# Patient Record
Sex: Female | Born: 1965 | Race: White | Hispanic: No | Marital: Single | Smoking: Former smoker
Health system: Southern US, Community
[De-identification: ages and names within clinical notes are randomized; demographics above are authoritative.]

## PROBLEM LIST (undated history)

## (undated) DIAGNOSIS — M79661 Pain in right lower leg: Secondary | ICD-10-CM

## (undated) DIAGNOSIS — I639 Cerebral infarction, unspecified: Secondary | ICD-10-CM

## (undated) DIAGNOSIS — M79671 Pain in right foot: Secondary | ICD-10-CM

## (undated) DIAGNOSIS — I82409 Acute embolism and thrombosis of unspecified deep veins of unspecified lower extremity: Secondary | ICD-10-CM

## (undated) DIAGNOSIS — M25562 Pain in left knee: Secondary | ICD-10-CM

## (undated) HISTORY — PX: ABDOMINAL SURGERY: SHX537

## (undated) HISTORY — PX: TONSILLECTOMY: SUR1361

## (undated) HISTORY — DX: Pain in right lower leg: M79.661

## (undated) HISTORY — DX: Pain in left knee: M25.562

## (undated) HISTORY — DX: Pain in right foot: M79.671

---

## 2009-06-07 ENCOUNTER — Ambulatory Visit: Payer: Self-pay | Admitting: Sports Medicine

## 2009-06-07 DIAGNOSIS — M216X9 Other acquired deformities of unspecified foot: Secondary | ICD-10-CM

## 2009-06-22 ENCOUNTER — Ambulatory Visit: Payer: Self-pay | Admitting: Sports Medicine

## 2009-07-06 ENCOUNTER — Ambulatory Visit: Payer: Self-pay | Admitting: Sports Medicine

## 2009-07-20 ENCOUNTER — Ambulatory Visit: Payer: Self-pay | Admitting: Sports Medicine

## 2010-06-04 ENCOUNTER — Ambulatory Visit: Payer: Self-pay | Admitting: Sports Medicine

## 2010-06-04 DIAGNOSIS — S93401A Sprain of unspecified ligament of right ankle, initial encounter: Secondary | ICD-10-CM

## 2010-06-05 ENCOUNTER — Ambulatory Visit: Payer: Self-pay | Admitting: Sports Medicine

## 2010-06-05 DIAGNOSIS — R269 Unspecified abnormalities of gait and mobility: Secondary | ICD-10-CM

## 2010-09-18 NOTE — Assessment & Plan Note (Signed)
Summary: 2:00APPT,ORTHOTICS PER [facility]   History of Present Illness: Patient brought back for custom orthotics tried 8 mile run today some lat foot swelling on RT no Bony tenderness noted by patient  with HX of stress fxs we want to try to get her into orthotics today before she does 22 mile training run  Allergies: No Known Drug Allergies  Physical Exam  General:  Well-developed,well-nourished,in no acute distress; alert,appropriate and cooperative throughout examination Msk:  marked cavus foot change soft tissue swelling is minimal and is more located over sinus tarsi on RT only no TT percussion over tib, fib or tarsal bones  gait is forefoot strike withrapid forefoot pronation to first MTP   Impression & Recommendations:  Problem # 1:  ANKLE PAIN, RIGHT (ICD-719.47) this seems soft tissue now no real evidence for stress fx but she is still sensitive over med malleolus also sensitive over sessamoids  Problem # 2:  ABNORMALITY OF GAIT (ICD-781.2)  Patient was fitted for a standard, cushioned, semi-rigid orthotic.  The orthotic was heated and the patient stood on the orthotic blank positioned on the orthotic stand. The patient was positioned in subtalar neutral position and 10 degrees of ankle dorsiflexion in a weight bearing stance. After completion of molding a stable based was applied to the orthotic blank.   The blank was ground to a stable position for weight bearing. size 10 blue swirl base  blue EVA med posting  sessamoid cutout padding on RT additional orthotic padding  first ray post on left  time 40 min  Orders: Orthotic Materials, each unit (L3002)  Problem # 3:  OTHER ACQUIRED DEFORMITY OF ANKLE AND FOOT OTHER (ICD-736.79)  with cavus foot she needs to remain in orthotics for running I don't think sports insoles will be adequate for marathon distances  break in this month and try to use for marathon  Orders: Orthotic Materials, each unit  (L3002)   reck in several mos p adjusting to these  gait looks good once orthotics in place   Orders Added: 1)  Est. Patient Level III [84696] 2)  Orthotic Materials, each unit [L3002]

## 2010-09-18 NOTE — Assessment & Plan Note (Signed)
Summary: pain and swelling to rt foot when running per nm/bmc   Vital Signs:  Patient profile:   45 year old female Pulse rate:   65 / minute BP sitting:   126 / 81  (left arm)  Vitals Entered By: Rochele Pages RN (June 04, 2010 10:03 AM) CC: rt foot pain and swelling with activity x 2 weeks   CC:  rt foot pain and swelling with activity x 2 weeks.  History of Present Illness: Patient presents for evaluation of rt foot pain and swelling with activity x2 weeks.   Patient runs 40 miles per week, cycles 50 miles per week, and swims 1 mile per week. She is training for a marathon on Nov 12.   She first started noticing swelling in her right calf about 2 weeks ago.  Now she reports pain and swelling above and over her her medial maleolous, great toe and ball of foot, and top of foot.  Has 22 mile training run this weekend.  Current Problems (verified): 1)  Ankle Pain, Right  (ICD-719.47) 2)  Other Acquired Deformity of Ankle and Foot Other  (ICD-736.79)  Current Medications (verified): 1)  None  Allergies (verified): No Known Drug Allergies  Past History:  Past Medical History: 3 prior stress fx in last 5 years.   Family History: Mother with DVT  Social History: Runs 40 miles a week.  Has marathon NOv 12th.   Review of Systems  The patient denies anorexia, fever, weight loss, chest pain, syncope, and abdominal pain.    Physical Exam  General:  VS noted.  Well NAD Msk:  Left foot and lower leg. No swelling  or color changes noted.  High arches at navicular prominances 4.5 cm above heel. Flattens to 4cm standing.  Non tender to palpation or percussion.  Mild tenderness over the medial maleoleus with leg squeeze.   Otherwise foot and leg WNL.  Extremities:  Right calf 36 cm 10cm distal to tibial palt Left calf 35 cm 10cm distal to tibial plat   Impression & Recommendations:  Problem # 1:  ANKLE PAIN, RIGHT (ICD-719.47) Stress reaction vs bone pain. Pt at  risk for reccurent tibail stress fx based on prior history and high arch.  Plan to have patient come back tomorrow for custom cushioned orthotic. Need to reduce shock to leg with running.  Will follow up tomorrow.   Problem # 2:  OTHER ACQUIRED DEFORMITY OF ANKLE AND FOOT OTHER (ICD-736.79) With degree oif cavus change in foot I think she will cont to get recurrent stress fxs unless we make an intervention during running   Orders Added: 1)  Est. Patient Level III [40347]

## 2010-10-20 ENCOUNTER — Encounter: Payer: Self-pay | Admitting: *Deleted

## 2012-01-15 DIAGNOSIS — J309 Allergic rhinitis, unspecified: Secondary | ICD-10-CM | POA: Insufficient documentation

## 2012-03-18 DIAGNOSIS — L578 Other skin changes due to chronic exposure to nonionizing radiation: Secondary | ICD-10-CM | POA: Insufficient documentation

## 2012-04-06 DIAGNOSIS — I610 Nontraumatic intracerebral hemorrhage in hemisphere, subcortical: Secondary | ICD-10-CM | POA: Insufficient documentation

## 2012-04-08 DIAGNOSIS — I82409 Acute embolism and thrombosis of unspecified deep veins of unspecified lower extremity: Secondary | ICD-10-CM | POA: Insufficient documentation

## 2012-04-15 DIAGNOSIS — R002 Palpitations: Secondary | ICD-10-CM | POA: Insufficient documentation

## 2012-04-17 DIAGNOSIS — Z7901 Long term (current) use of anticoagulants: Secondary | ICD-10-CM | POA: Insufficient documentation

## 2012-07-23 ENCOUNTER — Encounter: Payer: Self-pay | Admitting: Sports Medicine

## 2012-07-23 ENCOUNTER — Ambulatory Visit (INDEPENDENT_AMBULATORY_CARE_PROVIDER_SITE_OTHER): Payer: 59 | Admitting: Sports Medicine

## 2012-07-23 VITALS — BP 138/80 | HR 62 | Ht 66.0 in | Wt 132.0 lb

## 2012-07-23 DIAGNOSIS — M25562 Pain in left knee: Secondary | ICD-10-CM

## 2012-07-23 DIAGNOSIS — M25569 Pain in unspecified knee: Secondary | ICD-10-CM

## 2012-07-23 HISTORY — DX: Pain in left knee: M25.562

## 2012-07-23 NOTE — Assessment & Plan Note (Signed)
Advised patient to restart her activities  I did not see any evidence of serious knee injury  If she does not get swelling or excessive pain with running I think she can continue this  She will recheck with me if pain returns

## 2012-07-23 NOTE — Progress Notes (Signed)
Patient ID: Agatha Duplechain, female   DOB: [birth year], 46 y.o.   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 Clotting studies normal MRI and CTA did not show AVM  This started with first mile of run Ran 14 more miles but by mile 3 had blurred vision  She also had DVT from angiogram in RT leg Put on Coumadin Has to complete 6 mos  Neurologist - Dr Thad Ranger at [facility] - cleared to run  Cardiac workup - neg and no BP increase abnormal on ETT  Had medial malleolar stress fx about 5 yrs ago RT  Left knee is swelling since this past Saturday Thursday she had some loss of balance with wind gust running in [place] By next day knee painful  Examination NAD  Left Knee: Normal to inspection with no erythema or effusion or obvious bony abnormalities. Palpation normal with no warmth or joint line tenderness or patellar tenderness or condyle tenderness. ROM normal in flexion and extension and lower leg rotation. Ligaments with solid consistent endpoints including ACL, PCL, LCL, MCL. Negative Mcmurray's and provocative meniscal tests. Non painful patellar compression. Patellar and quadriceps tendons unremarkable. Hamstring and quadriceps strength is normal.  Running gait does not show any evidence of limp  MSK ultrasound There is no effusion in the suprapatellar pouch The quadriceps and patellar tendons appear normal The medial and lateral meniscus appear normal

## 2012-10-12 DIAGNOSIS — N950 Postmenopausal bleeding: Secondary | ICD-10-CM | POA: Insufficient documentation

## 2014-10-19 ENCOUNTER — Encounter (INDEPENDENT_AMBULATORY_CARE_PROVIDER_SITE_OTHER): Payer: Self-pay

## 2014-10-19 ENCOUNTER — Ambulatory Visit (INDEPENDENT_AMBULATORY_CARE_PROVIDER_SITE_OTHER): Payer: 59 | Admitting: Family Medicine

## 2014-10-19 ENCOUNTER — Encounter: Payer: Self-pay | Admitting: Family Medicine

## 2014-10-19 VITALS — BP 127/82 | HR 55 | Ht 66.0 in | Wt 130.0 lb

## 2014-10-19 DIAGNOSIS — M79671 Pain in right foot: Secondary | ICD-10-CM

## 2014-10-19 NOTE — Patient Instructions (Signed)
Your exam and ultrasound are reassuring. This pain is due to early plantar fasciitis and posterior tibialis tendinopathy. Cross train the next week (cycling, swimming). Icing 15 minutes at a time 3-4 times a day. Do simple motion exercises (up/downs, side to sides, alphabet) with ankle 3 sets of 10 (do alphabet twice). After 2-3 days start the theraband strengthening exercises and calf raise/drop exercise 3 sets of 10 once a day. When returning to running start at about 50% your regular training program for each day and increase by 10% each week roughly. Follow up with me in 4 weeks. I would run your long one 2-3 weeks before the marathon.

## 2014-10-24 DIAGNOSIS — R7989 Other specified abnormal findings of blood chemistry: Secondary | ICD-10-CM | POA: Insufficient documentation

## 2014-10-24 DIAGNOSIS — M79671 Pain in right foot: Secondary | ICD-10-CM | POA: Insufficient documentation

## 2014-10-24 DIAGNOSIS — H6980 Other specified disorders of Eustachian tube, unspecified ear: Secondary | ICD-10-CM | POA: Insufficient documentation

## 2014-10-24 DIAGNOSIS — N289 Disorder of kidney and ureter, unspecified: Secondary | ICD-10-CM | POA: Insufficient documentation

## 2014-10-24 HISTORY — DX: Pain in right foot: M79.671

## 2014-10-24 NOTE — Assessment & Plan Note (Signed)
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  Exam and ultrasound reassuring and no bony tenderness.  Advised to cross train the next week.  Icing, strengthening exercises reviewed.  Reviewed return to running protocol as well.  Call with any questions or concerns, f/u in 4 weeks.

## 2014-10-24 NOTE — Progress Notes (Signed)
PCP: No primary care provider on file.  Subjective:   HPI: Patient is a 49 y.o. female here for right foot pain.  Patient is currently training for the [place] marathon on 4/18. She started to get pain in the arch of her foot on Saturday at 16 of 19 miles. Monday hurt as well when only 4 1/2 miles into an 8 mile run. Bothered her yesterday when doing hill sprints. Felt swollen last night so decided to make an appointment. Has history of stress fractures but not in this foot. Was ill last week and had a fever - took a break from her training.  No past medical history on file.  No current outpatient prescriptions on file prior to visit.   No current facility-administered medications on file prior to visit.    No past surgical history on file.  Allergies  Allergen Reactions  . Tetracycline Nausea And Vomiting  . Xarelto [Rivaroxaban]     History   Social History  . Marital Status: Single    Spouse Name: N/A  . Number of Children: N/A  . Years of Education: N/A   Occupational History  . Not on file.   Social History Main Topics  . Smoking status: Former Smoker    Quit date: 08/19/2004  . Smokeless tobacco: Never Used  . Alcohol Use: Not on file  . Drug Use: Not on file  . Sexual Activity: Not on file   Other Topics Concern  . Not on file   Social History Narrative    No family history on file.  BP 127/82 mmHg  Pulse 55  Ht 5\' 6"  (1.676 m)  Wt 130 lb (58.968 kg)  BMI 20.99 kg/m2  Review of Systems: See HPI above.    Objective:  Physical Exam:  Gen: NAD  Right foot/ankle: No gross deformity, swelling, ecchymoses FROM with mild pain on internal rotation. TTP proximal to navicular.  No focal bony tenderness. Negative ant drawer and talar tilt.   Negative syndesmotic compression. Negative hop test. Thompsons test negative. NV intact distally.    msk u/s: no evidence bony irregularity of 1st metatarsal, medial malleolus, navicular.  No increased  fluid or tear of post tibialis tendon or sheath.  Assessment & Plan:  1. 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  Exam and ultrasound reassuring and no bony tenderness.  Advised to cross train the next week.  Icing, strengthening exercises reviewed.  Reviewed return to running protocol as well.  Call with any questions or concerns, f/u in 4 weeks.

## 2014-10-25 ENCOUNTER — Ambulatory Visit: Payer: Self-pay | Admitting: Sports Medicine

## 2014-11-16 ENCOUNTER — Ambulatory Visit: Payer: 59 | Admitting: Family Medicine

## 2015-10-03 ENCOUNTER — Ambulatory Visit (INDEPENDENT_AMBULATORY_CARE_PROVIDER_SITE_OTHER): Payer: 59 | Admitting: Sports Medicine

## 2015-10-03 ENCOUNTER — Encounter: Payer: Self-pay | Admitting: Sports Medicine

## 2015-10-03 VITALS — BP 133/80 | HR 65 | Ht 66.0 in | Wt 132.0 lb

## 2015-10-03 DIAGNOSIS — M65852 Other synovitis and tenosynovitis, left thigh: Secondary | ICD-10-CM

## 2015-10-03 DIAGNOSIS — S76309D Unspecified injury of muscle, fascia and tendon of the posterior muscle group at thigh level, unspecified thigh, subsequent encounter: Secondary | ICD-10-CM

## 2015-10-03 DIAGNOSIS — M76892 Other specified enthesopathies of left lower limb, excluding foot: Secondary | ICD-10-CM

## 2015-10-03 NOTE — Progress Notes (Signed)
Kim Gentry - 50 y.o. female MRN [id number]  Date of birth: 1966/06/09  SUBJECTIVE:  Including CC & ROS.  No chief complaint on file. CC: L hamstring pain  HPI: Patient presents with L hamstring pain which began on 08/12/2015. Patient is an active runner and regularly competes in half and full marathons. While running on Dec 24th, she noticed a sharp pain in her left buttock. Patient continued to run through the pain, but it significantly limited both her speed and her ability to run up or down hills. Pain radiates to knee and sometimes to the front of the hip, but never into the calf. Rest improves the pain. Patient was seen by [facility] orthopedics and had MRI, which reported ischio-femoral impingement, a small hamstring tear, fatty infiltration of the quadratus femoris, and hamstring tendonitis. Patient was then referred to a second orthopedics provider, who suggested a pudendal nerve block. Patient was very hesitant to pursue this treatment plan, so she scheduled appointment with [facility] for second opinion.  Significant interval history includes: Stress fracture of the femur, near the lesser trochanter in March 2016. Patient evaluated by [facility] sports medicine and remained non-weight bearing for 3 months. Began PT in April 2016 and continued exercises until Dec 2016. Developed rotator cuff injury from using crutches, which also required PT. Was able to resume running in August. Gradually increased distance and was able to complete 2 half-marathons during the fall.    HISTORY: Past Medical, Surgical, Social, and Family History Reviewed & Updated per EMR.   Pertinent Historical Findings include: PMH significant for history of stroke in Aug 2012 - no residual deficits Former smoker (quit in 2006)  DATA REVIEWED: MRI from 07/2015 - while patient has both tendon changes and Ischio-femoral impingment as noted, none of changes seem impressive to me.  Tendinosis of HS and glut med may  be issues.  I reviewed disk from [facility].  ROS no sciatic radiation of pain;  No joint swelling; no night pain.  PHYSICAL EXAM:  VS: BP:133/80 mmHg  HR:65bpm  TEMP: ( )  RESP:   HT:5\' 6"  (167.6 cm)   WT:132 lb (59.875 kg)  BMI:21.3 PHYSICAL EXAM: Gen: Well-appearing Caucasian female in NAD. HEENT: normocephalic, atraumatic, EOMI  L leg:  No erythema, or soft tissue swelling of the buttock, posterior thigh, or joint effusion of the knee. TTP over the quadratus femoris. No TTP over ischial tuberosity Straight leg raise to 70 degrees without tightness. Full flexion.  3/5 strength in biceps femoris, semitendinosis, and semimembranosis Strong at 90 deg of knee flex but HS testing at extension or full flexion is very weak 3/5 gluteus minimus and gluteus medius strength. 4/5 gluteus maximus strength.  Unable to perform 1 leg stance 2/2 weakness. Neurovascularly intact.  R leg: No erythema or soft tissue swelling of the buttock, posterior thigh, or effusion of the knee. No TTP over ischial tuberosity or quadratus femoris. Straight leg raise to 90 degrees without tightness. Full flexion. 5/5 strength in biceps femoris, semitendinosis, and semimembranosis. 5/5 gluteus minimus, gluteus medius, and gluteus maximus strength.  Able to perform 1 leg stance. Neurovascularly intact.  Running gait She has a shortened stride. There is now limping on the left with poor push off of the left leg and some hesitation.  ASSESSMENT & PLAN: See problem based charting & AVS for pt instructions. 1.) L Hamstring injury / weakness - Patient with significant weakness on physical exam in L hamstring and hip abductors. Weakness began while  patient was non-weight bearing for 3 months after femoral stress fracture. Hamstrings remained weak despite PT, which led to her injury in December. Extensively discussed MRI results with patient. Current pain and weakness is most consistent with hamstring strain, rather than  ischio-femoral impingement. - Provided exercises to improve hamstring and abductor strength - Information provided for patient to order thigh compression sleeve to wear while running for additional support - May continue to run, but advised against hills and speed work - Will follow up in 6 weeks

## 2015-10-03 NOTE — Assessment & Plan Note (Signed)
compression HEP OK to run easy until recheck Heel lifts  Consider NTG if not responding

## 2015-10-03 NOTE — Patient Instructions (Signed)
Hamstring strengthening exercises ([place]):  1.) Diver - stand on left leg, keep knee straight, and bend down toward the ground with R leg raised  2.) While lying on back, pull L leg toward chest with hands behind your thigh. Gradually extend your knee until you feel tightness. - Repeat 2x daily  3.) Backwards lunge - Keep your right leg forward and lunge backward with your left leg  4.) Forward lunge - Keep your right leg back and lunge forward with your left leg.  *Can gradually increase the difficulty with ankle weights with lunges  5.) Side leg raises - Lay on your side and lift your leg up while keeping the knee straight. Do 1 set with ankle straight, 1 with ankle inverted, and 1 with ankle everted.

## 2015-10-04 ENCOUNTER — Encounter: Payer: Self-pay | Admitting: Sports Medicine

## 2015-11-14 ENCOUNTER — Encounter: Payer: Self-pay | Admitting: Sports Medicine

## 2015-11-14 ENCOUNTER — Ambulatory Visit (INDEPENDENT_AMBULATORY_CARE_PROVIDER_SITE_OTHER): Payer: 59 | Admitting: Sports Medicine

## 2015-11-14 VITALS — BP 135/81 | Ht 66.0 in | Wt 132.0 lb

## 2015-11-14 DIAGNOSIS — M76892 Other specified enthesopathies of left lower limb, excluding foot: Secondary | ICD-10-CM

## 2015-11-14 DIAGNOSIS — M65852 Other synovitis and tenosynovitis, left thigh: Secondary | ICD-10-CM

## 2015-11-14 NOTE — Patient Instructions (Signed)
You have improved a lot  Keep up strength at least 3 x per week  3 days per week do this rehab workout Easy 1 mile warm-up Add hops x 10  long strides x 10 Add easy up and easy down hill repeats x 10 Then 1 mile warm down  See me in 2 to 3 months

## 2015-11-14 NOTE — Progress Notes (Signed)
Patient ID: Kim Gentry, female   DOB: Sep 08, 1965, 50 y.o.   MRN: [id number]  CC: F/U of left hamstring weakness  Since last visit able to run steadily but feels slow 80% of pain is resolved Doing HEP She has gradually inc weight to 8 lbs  Has increased running mileage to ~ 30 MPW  Soc HX: non smoker  ROS No sciatica No groin pain No generalized joint issues  PEXAM NAD BP 135/81 mmHg  Ht 5\' 6"  (1.676 m)  Wt 132 lb (59.875 kg)  BMI 21.32 kg/m2  Left HS - pain on previious exam with resisted testing is resolved Strength prior exam was 3+ to 4/ 5 Hamstring strength is now 5/5 left in mult positions LT HS is about 1 cm smaller circ.  Hip abduction now 5/5 Hip rotatoin now 5/5  Running gait is slow but able to hop without pain/ can do long strides

## 2015-11-14 NOTE — Assessment & Plan Note (Signed)
Exc. Improvement in strength  We will progress her workouts  Add running drills - easy up and down hills  Reck 2 mos

## 2016-01-23 ENCOUNTER — Ambulatory Visit (INDEPENDENT_AMBULATORY_CARE_PROVIDER_SITE_OTHER): Payer: 59 | Admitting: Sports Medicine

## 2016-01-23 ENCOUNTER — Encounter: Payer: Self-pay | Admitting: Sports Medicine

## 2016-01-23 VITALS — BP 127/77 | HR 54 | Ht 66.0 in | Wt 130.0 lb

## 2016-01-23 DIAGNOSIS — M76892 Other specified enthesopathies of left lower limb, excluding foot: Secondary | ICD-10-CM

## 2016-01-23 DIAGNOSIS — M65852 Other synovitis and tenosynovitis, left thigh: Secondary | ICD-10-CM

## 2016-01-23 NOTE — Progress Notes (Signed)
Patient ID: Kim Gentry, female   DOB: [birth year], 50 y.o.   MRN: [id number]  CC: F/u L hamstring  HPI: Kim Gentry is a 50 yo female presenting for f/u L hamstring pain/weakness.  Pt states she feels pretty much back to normal when it comes to her previous hamstring pain and weakness.  She is back to running her baseline of 30 miles per week and has successfully incorporated hills without pain.  Has been intermittently using hamstring sleeve.  Does note however one tender spot on the back of her leg near the upper hamstring/lower buttock.  States she feels a hard nodule there that is tender during/after runs and when she presses/sits on it.  Has been there since January, 4/10 pain at its worst but sometimes doesn't hurt at all.  Not trying anything for it.  Excited she has her strength back and wants to work up to full marathon in November.   ROS:  Gen: - fevers/chills MSK: - joint/bone/muscle pain except as in HPI Neuro: - change in gait  PE:  BP 127/77 mmHg  Pulse 54  Ht 5\' 6"  (1.676 m)  Wt 130 lb (58.968 kg)  BMI 20.99 kg/m2 Gen: middle-age woman sitting on exam table in NAD HEENT: normocephalic, atraumatic Pulm: normal work of breathing MSK:  L leg: no joint, bony, or skin abnormalities on inspection, + TTP over nodular structure near midline of posterior thigh near gluteal cleft, full active ROM of knee and hip, knee extension/flexion and hip abduction/adduction strength 5/5, able to hop comfortably 20 feet on L leg, sensation grossly intact/  HS strong tested in mult. positions R leg: no joint, bony, or skin abnormalities on inspection, non-TTP, full active ROM, 5/5 strength knee extension/flexion, hip abduction/adduction, able to hop comfortably feet on R leg, sensation grossly intact Neuro: gait unhindered,  run stride even stride length No excessivet pronation/supination  A/P:  Kim Gentry is a 50 yo female fully healed of L hamstring tendonitis and wekaness with residual tenderpoint  over superior hamstring  L Hamstring tendonitis Pt has recovered fully from this injury 6 months ago given return of normal strength and no residual hamstring pain while running (aside from below) -- Pt advised to continue with running regimen and train for marathon as she wishes, no restrictions -- Advised to use sleeve for longer runs as may help her pain -- Since felt more unsteady hopping on L foot compared to R, advised one leg squats for balance training -- F/U PRN  L superior hamstring tenderpoint Likely cause of pt's tender knot of tissue underneath L gluteal cleft.  Likely composed of small piece of tendon or fascial that has torn and formed ball-like structure.   -- Advised pt that this should not hinder her running ability and should go away with time -- If becomes worse or starts to hamper her activity, set up f/u visit  Francie Massingominick DeFelice, MS4  Agree with assessment and evaluation.  Sterling BigKB Taliana Mersereau, MD

## 2016-01-23 NOTE — Assessment & Plan Note (Signed)
Strength has returned to normal  See OV note  Cont her exercise regimen for HS at least 3x per week over next sev mos.

## 2016-06-04 ENCOUNTER — Encounter: Payer: Self-pay | Admitting: Sports Medicine

## 2016-06-04 ENCOUNTER — Ambulatory Visit (INDEPENDENT_AMBULATORY_CARE_PROVIDER_SITE_OTHER): Payer: 59 | Admitting: Sports Medicine

## 2016-06-04 DIAGNOSIS — S93401A Sprain of unspecified ligament of right ankle, initial encounter: Secondary | ICD-10-CM

## 2016-06-04 NOTE — Assessment & Plan Note (Signed)
This is starting to resolve and suspect was Grade 2  OK to steadily increase activity  Compression would be helpful for next 6 weeks  Reck if probs.

## 2016-06-04 NOTE — Progress Notes (Signed)
   Subjective:  Patient ID: Kim Gentry, female    DOB: 1966/02/07  Age: 50 y.o. MRN: [id number]  CC: No chief complaint on file.   HPI Kim Gentry is a 50 year old woman with a PMH significant for prior cuboidal stress fracture, presents today for right ankle pain. Patient reports that she was attending the "[facility]" for blood cancer. Patient reported that it was dark and raining outside and because of this, she walked into a pothole and twisted her ankle on October 7. Patient's ankle was very swollen. She consulted her orthopedic friend who said that she most likely only had a sprain. Over the next 10 days, swelling has reduced, but patient reports she still has pain when inverting the foot. Patient says that the pain is in the lateral part of her ankle. Patient just ran 2 miles on a treadmill today without much issue. Patient wants to run a marathon she planned for November 5.   Soc Hx: Non smoker who quit in 2006  ROS Review of Systems  Musculoskeletal:       Right Ankle Pain      Right Ankle Swelling  Refer to HPI for pertinent negatives and positives  Objective:  BP (!) 147/73   Pulse (!) 55   Ht 5\' 6"  (1.676 m)   Wt 130 lb (59 kg)   BMI 20.98 kg/m    Physical Exam  Constitutional: She appears well-developed and well-nourished.  Musculoskeletal:  Left Foot: No lesions, deformities, or swelling. Left ankle ligamentous laxity present on eversion and inversion. No bony tenderness. Normal ROM. Normal stability. Neurovascularly intact.  Right Foot: No lesions or deformities. Minor residual swelling on lateral right ankle over peroneal tendon. No real ligamentous deficit on right compared to left foot.  No bony tenderness. 10-15% less ROM when compared to left foot. Normal stability. Neurovascularly intact.      Assessment & Plan:   1. Right Ankle Pain Secondary to Ankle Sprain Based on physical exam and history, we believe that this is a right ankle sprain. Informed  patient and discussed options for treatment. Recommended the use of a compression sleeve for ankle while running and gave her one foot balance and theraband exercises to strengthen her ankle joint. Patient agreed with this course of action. Patient wondered if she could still run marathon in November and if it would be fully healed by then. Informed patient that she could run it then, but there would be no guarantee that her injury would be completely resolved by then. Patient elected to delay marathon in November, and run marathon in December.    Follow-up: No Follow-up on file.   Kim Gentry, Medical Student  I observed and examined the patient with the student and agree with assessment and plan.  Note reviewed and modified by me. Sterling BigKB Fields, MD

## 2016-07-04 ENCOUNTER — Encounter: Payer: Self-pay | Admitting: Sports Medicine

## 2016-07-04 ENCOUNTER — Ambulatory Visit (INDEPENDENT_AMBULATORY_CARE_PROVIDER_SITE_OTHER): Payer: 59 | Admitting: Sports Medicine

## 2016-07-04 DIAGNOSIS — M79661 Pain in right lower leg: Secondary | ICD-10-CM

## 2016-07-04 HISTORY — DX: Pain in right lower leg: M79.661

## 2016-07-04 NOTE — Progress Notes (Signed)
  Kim HomerMary Gentry - 50 y.o. female MRN [id number]  Date of birth: 1965/11/04  SUBJECTIVE:  Including CC & ROS.   Kim HomerMary Gentry is a 50 y.o. F who presents for evaluation of R shin pain x1 wk.  Pain feels light a tightness and searing pain.  Pain is worse in medial lower leg.  Worse with running.  Is better today than yesterday and ran 5 miles without pain this AM.  Last night there was purple discoloration and swelling of calf and foot that is better today.  She has tried ice and compression which made it worse.  She took 1 dose of an NSAID last night which helped some.  She is working toward running a marathon in 3 wks and planning 22 mile run in 2 days.  HISTORY: Past Medical, Surgical, Social, and Family History Reviewed & Updated per EMR.   Pertinent Historical Findings include: PMSHx -  DVT, CVA, osteopenia, stress fractures of femur, cuboid, sesmoid, medial malleolus  PSHx -  Never smoker FHx -  noncontributory Medications - Forteo, ASA 325 daily  DATA REVIEWED: none  PHYSICAL EXAM:  VS: BP:136/62  HR: bpm  TEMP: ( )  RESP:   HT:5\' 5"  (165.1 cm)   WT:132 lb (59.9 kg)  BMI:22 PHYSICAL EXAM: Gen: NAD, alert, cooperative with exam, well-appearing HEENT: clear conjunctiva, EOMI CV:  no edema, capillary refill brisk,  Resp: non-labored, normal speech Skin: no rashes, normal turgor  Neuro: no gross deficits.  Psych:  alert and oriented  R leg: high arches of b/l feet, TTP with deep palpation along medial mid-tibia, no pain with stress of posterior tibialis, strength intact, ROM intact, homan's negative, no calf tenderness  Gait: wide, shuffling, hard impact, no pain in shin while running or hopping  US R lower leg: intact cortex of tibia, varicose vein in area of maximum pain with localized increase in flow and dilation  ASSESSMENT & PLAN:   Pain in right shin Suspect this is related to ruptured varicose vein as seen on US No evidence of stress fracture on US and no pain with high  impact testing such as running/hopping No evidence of DVT Doubt shin splints in experienced runner Add scaphoid pad to sports insoles in R shoe Rest (no long runs) for 2-3 days F/u prn    Erasmo DownerAngela M Bacigalupo, MD, MPH  I observed and examined the patient with the resident and agree with assessment and plan.  Note reviewed and modified by me.   Enid BaasKarl Fields, MD  07/04/2016 12:25 PM

## 2016-07-04 NOTE — Assessment & Plan Note (Signed)
Suspect this is related to ruptured varicose vein as seen on US No evidence of stress fracture on US and no pain with high impact testing such as running/hopping No evidence of DVT Doubt shin splints in experienced runner Add scaphoid pad to sports insoles in R shoe Rest (no long runs) for 2-3 days F/u prn

## 2018-03-11 ENCOUNTER — Emergency Department (INDEPENDENT_AMBULATORY_CARE_PROVIDER_SITE_OTHER)
Admission: EM | Admit: 2018-03-11 | Discharge: 2018-03-11 | Disposition: A | Discharge status: Home / Self Care | Payer: 59 | Source: Home / Self Care | Attending: Family Medicine | Admitting: Family Medicine

## 2018-03-11 ENCOUNTER — Other Ambulatory Visit: Payer: Self-pay

## 2018-03-11 ENCOUNTER — Encounter: Payer: Self-pay | Admitting: Emergency Medicine

## 2018-03-11 DIAGNOSIS — S0501XA Injury of conjunctiva and corneal abrasion without foreign body, right eye, initial encounter: Secondary | ICD-10-CM | POA: Diagnosis not present

## 2018-03-11 HISTORY — DX: Cerebral infarction, unspecified: I63.9

## 2018-03-11 HISTORY — DX: Acute embolism and thrombosis of unspecified deep veins of unspecified lower extremity: I82.409

## 2018-03-11 MED ORDER — SULFACETAMIDE SODIUM 10 % OP SOLN: 1.0000 [drp] | OPHTHALMIC | 0 refills | Status: DC

## 2018-03-11 NOTE — Discharge Instructions (Addendum)
If symptoms become significantly worse during the night or over the weekend, proceed to the local emergency room.  

## 2018-03-11 NOTE — ED Triage Notes (Signed)
Right eye scratched by her dog this morning

## 2018-03-11 NOTE — ED Provider Notes (Signed)
[facility]    CSN: [id number] Arrival date & time: 03/11/18  1712     History   Chief Complaint Chief Complaint  Patient presents with  . Eye Injury    HPI Kim Gentry is a 52 y.o. female.   Patient reports that about 6 hours ago her dog accidentally scratched her right eye. She continues to have a "gritty" sensation in her right eye.  The history is provided by the patient.  Eye Injury  This is a new problem. Episode onset: 6 hours ago. The problem occurs constantly. The problem has not changed since onset.Exacerbated by: blinking. Nothing relieves the symptoms. She has tried nothing for the symptoms.    Past Medical History:  Diagnosis Date  . DVT (deep venous thrombosis) (HCC)   . Stroke [facility])     Patient Active Problem List   Diagnosis Date Noted  . Pain in right shin 07/04/2016  . Hamstring tendonitis of left thigh 10/03/2015  . Dysfunction of eustachian tube 10/24/2014  . Elevated platelet count 10/24/2014  . Impaired renal function 10/24/2014  . Right foot pain 10/24/2014  . Cyst of ovary 10/12/2012  . Hemorrhage, postmenopausal 10/12/2012  . Knee pain, left 07/23/2012  . Cephalalgia 04/29/2012  . Long term current use of anticoagulant therapy 04/17/2012  . Awareness of heartbeats 04/15/2012  . Deep vein thrombosis (HCC) 04/08/2012  . 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 Allergic rhinitis 01/15/2012  . ABNORMALITY OF GAIT 06/05/2010  . Inversion sprain of right ankle 06/04/2010  . OTHER ACQUIRED DEFORMITY OF ANKLE AND FOOT OTHER 06/07/2009    Past Surgical History:  Procedure Laterality Date  . ABDOMINAL SURGERY    . TONSILLECTOMY      OB History   None      Home Medications    Prior to Admission medications   Medication Sig Start Date End Date Taking? Authorizing Provider  aspirin 325 MG tablet Take 325 mg by mouth.    [provider]  sulfacetamide (BLEPH-10) 10 % ophthalmic  solution Place 1-2 drops into the right eye every 4 (four) hours. Use while awake 03/11/18   Lattie Haw, MD    Family History No family history on file.  Social History Social History   Tobacco Use  . Smoking status: Former Smoker    Last attempt to quit: 08/19/2004    Years since quitting: 13.5  . Smokeless tobacco: Never Used  Substance Use Topics  . Alcohol use: Yes    Alcohol/week: 0.0 oz  . Drug use: Not Currently     Allergies   Tetracycline and Xarelto [rivaroxaban]   Review of Systems Review of Systems  Eyes: Positive for pain. Negative for photophobia, discharge, redness, itching and visual disturbance.  All other systems reviewed and are negative.    Physical Exam Triage Vital Signs ED Triage Vitals  Enc Vitals Group     BP 03/11/18 1740 132/83     Pulse Rate 03/11/18 1740 (!) 57     Resp --      Temp 03/11/18 1740 97.6 F (36.4 C)     Temp Source 03/11/18 1740 Oral     SpO2 03/11/18 1740 97 %     Weight 03/11/18 1741 140 lb (63.5 kg)     Height 03/11/18 1741 5\' 6"  (1.676 m)     Head Circumference --      Peak Flow --      Pain Score 03/11/18 1740 2  Pain Loc --      Pain Edu? --      Excl. in GC? --    No data found.  Updated Vital Signs BP 132/83 (BP Location: Right Arm)   Pulse (!) 57   Temp 97.6 F (36.4 C) (Oral)   Ht 5\' 6"  (1.676 m)   Wt 140 lb (63.5 kg)   LMP 03/12/2003 (Approximate)   SpO2 97%   BMI 22.60 kg/m   Visual Acuity Right Eye Distance: 20/30 Left Eye Distance: 20/200 Bilateral Distance: 20/30(w/o correction)  Right Eye Near:   Left Eye Near:    Bilateral Near:     Physical Exam  Constitutional: She appears well-developed and well-nourished. No distress.  HENT:  Head: Atraumatic.  Right Ear: External ear normal.  Left Ear: External ear normal.  Nose: Nose normal.  Mouth/Throat: Oropharynx is clear and moist.  Eyes: Pupils are equal, round, and reactive to light. Conjunctivae, EOM and lids are normal.  Lids are everted and swept, no foreign bodies found. Right eye exhibits no chemosis, no discharge, no exudate and no hordeolum. No foreign body present in the right eye. Left eye exhibits no discharge.    Instilled tetracaine gtt in right eye.  Fluorescein reveals a 2mm diameter minimal superficial corneal abrasion on the right as noted on diagram.   Cardiovascular: Normal rate.  Pulmonary/Chest: Effort normal.  Neurological: She is alert.  Skin: Skin is warm and dry.  Nursing note and vitals reviewed.    UC Treatments / Results  Labs (all labs ordered are listed, but only abnormal results are displayed) Labs Reviewed - No data to display  EKG None  Radiology No results found.  Procedures Procedures (including critical care time)  Medications Ordered in UC Medications - No data to display  Initial Impression / Assessment and Plan / UC Course  I have reviewed the triage vital signs and the nursing notes.  Pertinent labs & imaging results that were available during my care of the patient were reviewed by me and considered in my medical decision making (see chart for details).    Begin sulfacetamide ophthalmic solution. Followup with ophthalmologist if not improved 2 days.  Final Clinical Impressions(s) / UC Diagnoses   Final diagnoses:  Right corneal abrasion, initial encounter     Discharge Instructions     If symptoms become significantly worse during the night or over the weekend, proceed to the local emergency room.     ED Prescriptions    Medication Sig Dispense Auth. Provider   sulfacetamide (BLEPH-10) 10 % ophthalmic solution Place 1-2 drops into the right eye every 4 (four) hours. Use while awake 5 mL Lattie HawBeese, Julissa Browning A, MD        Lattie HawBeese, Lekeya Rollings A, MD 03/12/18 1038

## 2018-04-23 ENCOUNTER — Ambulatory Visit
Admission: RE | Admit: 2018-04-23 | Discharge: 2018-04-23 | Disposition: A | Discharge status: Home / Self Care | Payer: 59 | Source: Ambulatory Visit | Attending: Sports Medicine | Admitting: Sports Medicine

## 2018-04-23 ENCOUNTER — Encounter: Payer: Self-pay | Admitting: Sports Medicine

## 2018-04-23 ENCOUNTER — Encounter

## 2018-04-23 ENCOUNTER — Ambulatory Visit (INDEPENDENT_AMBULATORY_CARE_PROVIDER_SITE_OTHER): Payer: 59 | Admitting: Sports Medicine

## 2018-04-23 VITALS — BP 131/76 | HR 54 | Ht 66.0 in | Wt 134.0 lb

## 2018-04-23 DIAGNOSIS — M25552 Pain in left hip: Secondary | ICD-10-CM

## 2018-04-23 NOTE — Patient Instructions (Addendum)
Today's diagnosis: Left hip pain. - the plan today is to obtain a further workup to rule out a stress fracture  Imaging ordered:  - Left hip Xray - Left hip MRI  New Medications:  - None  Referrals:  - None  Other treatments recommended: - No running - Use crutches while walking to relieve pressure and pain - you may swim or use a stationary bike for cross training  Follow up: 2 weeks

## 2018-04-23 NOTE — Progress Notes (Signed)
  Kim Gentry - 52 y.o. female MRN [id number]  Date of birth: 04-May-1966    SUBJECTIVE:      Chief Complaint:/ HPI:  Patient is a 52 year old female with about 10 days of left hip pain.  Her pain began slowly with no specific injury.  Initially her pain was localized to the posterior hip but has not now begun to radiate around laterally and into the groin.  Initially her pain was only with running and cause her to shorten her stride.  She now experiences pain with walking and has since discontinued running.  Normally she runs at least 30 miles per week.  She does acknowledge a history significant for a femoral stress fracture about 3 years ago.  She uses Tylenol for pain which is somewhat helpful.  She denies any swelling, bruising, or erythema around the hip.  No skin changes.  She does note subjective feeling of weakness in the left hip compared to the right.   ROS:     See HPI  PERTINENT  PMH / PSH FH / / SH:  Past Medical, Surgical, Social, and Family History Reviewed & Updated in the EMR.  Pertinent findings include:  History of hemorrhagic stroke and DVT.  History of stress fracture  OBJECTIVE: BP 131/76   Pulse (!) 54   Ht 5\' 6"  (1.676 m)   Wt 134 lb (60.8 kg)   LMP 03/12/2003 (Approximate)   BMI 21.63 kg/m   Physical Exam:  Vital signs are reviewed.  GEN: Alert and oriented, NAD Pulm: Breathing unlabored PSY: normal mood, congruent affect  MSK: Left hip:  - Inspection: No gross deformity, no swelling, erythema, or ecchymosis - Palpation: Mild pain over the posterior aspect of the greater trochanter with deep palpation.  No tenderness over the proximal insertion of the hamstring - ROM: Normal range of motion on Flexion, extension, abduction, internal and external rotation without pain - Strength: 4+/5 strength with hip flexion and extension.  Otherwise 5/5 strength.  Some pain noted with resisted hip extension. - Special Tests: Negative FABER and FADIR. Negative Trendelenberg.   No pain with hop test  Right hip: No tenderness to palpation over the greater trochanter or proximal hamstring insertion Full range of motion with 5/5 strength Negative Trendelenburg N/V intact distally  ASSESSMENT & PLAN:  1.  Left hip pain- patient is worse than left deep hip pain with no specific injury.  Given her history of having a stress fracture, this is high on the differential to rule out. - X-ray left hip ordered -MRI left hip ordered -Patient advised to use crutches with walking in order to decrease pain - Tylenol as needed for pain control - Swimming or biking in place of running for crosstraining -Follow-up in 2 weeks

## 2018-04-26 ENCOUNTER — Ambulatory Visit
Admission: RE | Admit: 2018-04-26 | Discharge: 2018-04-26 | Disposition: A | Discharge status: Home / Self Care | Payer: 59 | Source: Ambulatory Visit | Attending: Sports Medicine | Admitting: Sports Medicine

## 2018-04-26 DIAGNOSIS — M25552 Pain in left hip: Secondary | ICD-10-CM

## 2018-04-28 ENCOUNTER — Telehealth (INDEPENDENT_AMBULATORY_CARE_PROVIDER_SITE_OTHER): Payer: Self-pay | Admitting: Sports Medicine

## 2018-04-28 NOTE — Telephone Encounter (Signed)
Attempted to call patient at 3:35 today. No answer and call did not forward to voice mail. Will attempt to make additional calls. Pt has followup appt scheduled. Based on MRI results, plan to discuss treatment with physical therapy and intraarticular steroid injection.

## 2018-04-28 NOTE — Telephone Encounter (Signed)
-----   Message from [facility] sent at 04/27/2018  3:43 PM EDT ----- Regarding: FW: mri results   ----- Message ----- From: Lizbeth Bark Sent: 04/27/2018   3:35 PM EDT To: Rutha Bouchard Subject: mri results                                    Pt is asking for a call back with her MRI results.

## 2018-04-30 ENCOUNTER — Telehealth: Payer: Self-pay | Admitting: Sports Medicine

## 2018-04-30 NOTE — Telephone Encounter (Signed)
Patient called regarding MRI results.  Previously obtained a copy of results.  We reviewed these over the phone today line by line and discussed treatment options.  At this time recommended she continue crosstraining with low impact exercises.  We rescheduled her follow-up appointment for 1 week from today as opposed to 2 weeks.  I briefly discussed with her the option of an intra-articular steroid injection.  We also discussed referral to physical therapy.  All patient's questions were answered.  She will follow-up in 1 week.

## 2018-05-07 ENCOUNTER — Encounter: Payer: Self-pay | Admitting: Sports Medicine

## 2018-05-07 ENCOUNTER — Ambulatory Visit: Payer: 59 | Attending: Sports Medicine | Admitting: Physical Therapy

## 2018-05-07 ENCOUNTER — Ambulatory Visit (INDEPENDENT_AMBULATORY_CARE_PROVIDER_SITE_OTHER): Payer: 59 | Admitting: Sports Medicine

## 2018-05-07 VITALS — BP 116/78 | Ht 66.0 in | Wt 132.0 lb

## 2018-05-07 DIAGNOSIS — S76312D Strain of muscle, fascia and tendon of the posterior muscle group at thigh level, left thigh, subsequent encounter: Secondary | ICD-10-CM

## 2018-05-07 DIAGNOSIS — M25552 Pain in left hip: Secondary | ICD-10-CM | POA: Diagnosis not present

## 2018-05-07 DIAGNOSIS — S76302D Unspecified injury of muscle, fascia and tendon of the posterior muscle group at thigh level, left thigh, subsequent encounter: Secondary | ICD-10-CM | POA: Diagnosis not present

## 2018-05-07 DIAGNOSIS — R262 Difficulty in walking, not elsewhere classified: Secondary | ICD-10-CM | POA: Insufficient documentation

## 2018-05-07 DIAGNOSIS — S73192D Other sprain of left hip, subsequent encounter: Secondary | ICD-10-CM | POA: Diagnosis present

## 2018-05-07 NOTE — Progress Notes (Signed)
  Kim HomerMary Gentry - 52 y.o. female MRN [id number]  Date of birth: Jun 02, 1966    SUBJECTIVE:      Chief Complaint:/ HPI:  Pt is a 52 -year-old female that returns for left hip pain.  Overall her pain is more localized to the posterior hip and is generally a 4/10 and occasionally sharp.  She continues to have occasional radiation of her pain anteriorly.  She has not been running since she was last seen.  She has been swimming and cycling without pain.  MRI was ordered at last visit and she is here today for follow-up.  He denies any new swelling, erythema, bruising.  No new numbness or tingling.  No associated skin changes  MRI shows tendinopathy and partial tearing of the left hamstring at its insertion.  There is evidence of ischiofemoral impingement.  Likely torn superior anterior labrum    ROS:     HPI  PERTINENT  PMH / PSH FH / / SH:  Past Medical, Surgical, Social, and Family History Reviewed & Updated in the EMR.     OBJECTIVE: BP 116/78   Ht 5\' 6"  (1.676 m)   Wt 132 lb (59.9 kg)   LMP 03/12/2003 (Approximate)   BMI 21.31 kg/m   Physical Exam:  Vital signs are reviewed.  GEN: Alert and oriented, NAD Pulm: Breathing unlabored PSY: normal mood, congruent affect  MSK: Left hip:  - Inspection: No gross deformity, no swelling, erythema, or ecchymosis - Palpation: No pain over greater trochanter.  Tenderness over the left initial tuberosity. - ROM: Normal range of motion on Flexion, extension, abduction, internal and external rotation without pain - Strength: 5/5 strength with hip flexion and extension.  Otherwise 5/5 strength.  - Neuro/vasc: NV intact - Special Tests: Negative FABER.  Positive FADIR.  Right hip: No tenderness to palpation over the greater trochanter or proximal hamstring insertion Full range of motion with 5/5 strength Negative Trendelenburg N/V intact distally  ASSESSMENT & PLAN:  1. Left acetabular labral tear -overall this is a less painful of the 2  issues.  Discussed potential steroid injection today.  Patient declined at this time.  She will be referred to physical therapy and she will follow-up in 6 weeks  2.  Proximal hamstring tendinopathy and partial tear-overall this is the more symptomatic of the 2 primary issues.  At this time will continue to restrict from running.  She may continue to cross train with cycling or swimming.  Will refer to physical therapy.  Follow-up 6 weeks

## 2018-05-07 NOTE — Patient Instructions (Addendum)
TENS UNIT: This is helpful for muscle pain and spasm.   Search and Purchase a TENS 7000 2nd edition at www.tenspros.com. It should be less than $30.     TENS unit instructions: Do not shower or bathe with the unit on Turn the unit off before removing electrodes or batteries If the electrodes lose stickiness add a drop of water to the electrodes after they are disconnected from the unit and place on plastic sheet. If you continued to have difficulty, call the TENS unit company to purchase more electrodes. Do not apply lotion on the skin area prior to use. Make sure the skin is clean and dry as this will help prolong the life of the electrodes. After use, always check skin for unusual red areas, rash or other skin difficulties. If there are any skin problems, does not apply electrodes to the same area. Never remove the electrodes from the unit by pulling the wires. Do not use the TENS unit or electrodes other than as directed. Do not change electrode placement without consultating your therapist or physician. Keep 2 fingers with between each electrode. Wear time ratio is 2:1, on to off times.    For example on for 30 minutes off for 15 minutes and then on for 30 minutes off for 15 minutes  HEP was printed for pt with SLR all planes, sit to stand, standing H.S curl, and SLS

## 2018-05-07 NOTE — Patient Instructions (Signed)
As we reviewed today you have 2 different injuries to your hip.  You have a labral tear in the front of your hip.  You also have a small tear of your hamstring posteriorly. Continue to focus on crosstraining and avoid running. NSAIDs as needed for pain We will refer to physical therapy to have them work on your hamstrings. If you find the pain in the front of her hip to be worsening, we can do a steroid injection to help with this. We will have you follow-up in 6 weeks

## 2018-05-08 ENCOUNTER — Encounter: Payer: Self-pay | Admitting: Physical Therapy

## 2018-05-08 NOTE — Therapy (Signed)
[facility] [address] [place], [place], [address] Phone: [phone]   Fax:  [phone]  Physical Therapy Evaluation  Patient Details  Name: Kim HomerMary Gentry MRN: [id number] Date of Birth: 1966-07-05 Referring Provider: Eleanora NeighborBarron, J. DO   Encounter Date: 05/07/2018  PT End of Session - 05/08/18 0807    Visit Number  1    Number of Visits  12    Date for PT Re-Evaluation  06/18/18    Authorization Type  UHC    PT Start Time  1630    PT Stop Time  1720    PT Time Calculation (min)  50 min    Activity Tolerance  Patient tolerated treatment well    Behavior During Therapy  [facility] for tasks assessed/performed       Past Medical History:  Diagnosis Date  . DVT (deep venous thrombosis) (HCC)   . Stroke [facility](HCC)     Past Surgical History:  Procedure Laterality Date  . ABDOMINAL SURGERY    . TONSILLECTOMY      There were no vitals filed for this visit.   Subjective Assessment - 05/08/18 0752    Subjective  Pt relays onset insideous onset of Lt hip pain over last 4 weeks that may have started when her dog pulled her quick and hard during running. She quit running but was still having pain so she went to MD. She had MRI showing "tendinopathy and partial tearing of the left hamstring at its insertion.  There is evidence of ischiofemoral impingement.  Likely torn superior anterior labrum"    How long can you walk comfortably?  less than 3-4 steps    Diagnostic tests  MRI    Patient Stated Goals  return to running    Currently in Pain?  Yes    Pain Score  5     Pain Location  Hip    Pain Orientation  Left    Pain Descriptors / Indicators  Aching;Sore;Sharp    Pain Type  Acute pain    Pain Radiating Towards  denies    Pain Onset  1 to 4 weeks ago    Pain Frequency  Intermittent    Aggravating Factors   WB activity    Pain Relieving Factors  sometimes NSAIDS but she is not suppossed to take these     Multiple Pain Sites  No          [facility] PT Assessment - 05/08/18 0001      Assessment   Medical Diagnosis  Lt hip pain, partial labral and H.S tear    Referring Provider  Eleanora NeighborBarron, J. DO    Onset Date/Surgical Date  04/07/18   onset, she hs not had surgery   Next MD Visit  ?    Prior Therapy  none      Precautions   Precautions  None      Restrictions   Weight Bearing Restrictions  No      Balance Screen   Has the patient fallen in the past 6 months  Yes   tripped while running   How many times?  1-2    Has the patient had a decrease in activity level because of a fear of falling?   No      Home Public house managernvironment   Living Environment  Private residence      Prior Function   Level of Independence  Independent    Vocation  Full time employment    Vocation Requirements  office work    Leisure  Artist Status  Within Functional Limits for tasks assessed      Observation/Other Assessments   Focus on Therapeutic Outcomes (FOTO)   52% limit      Sensation   Light Touch  Appears Intact      ROM / Strength   AROM / PROM / Strength  AROM;Strength      AROM   Overall AROM Comments  WFL except hip flxion and knee flexion slightly limited    AROM Assessment Site  --    Right/Left Hip  --    Left Hip Extension  --    Left Hip Flexion  --   slightly limited   Left Hip External Rotation   --    Left Hip Internal Rotation   --    Left Hip ABduction  --    Left Hip ADduction  --    Right/Left Knee  --      Strength   Strength Assessment Site  Hip;Knee    Right/Left Hip  Left    Left Hip Flexion  4/5    Left Hip Extension  4/5    Left Hip External Rotation  5/5    Left Hip Internal Rotation  5/5    Left Hip ABduction  4/5    Left Hip ADduction  4/5    Right/Left Knee  Left    Left Knee Flexion  5/5    Left Knee Extension  5/5      Flexibility   Soft Tissue Assessment /Muscle Length  --   tight H.S, quads, glutes on Lt     Palpation   Palpation comment  TTP  proximal H.S insertion      Ambulation/Gait   Gait Comments  slight antalgic gait on Lt                Objective measurements completed on examination: See above findings.      [facility] Adult PT Treatment/Exercise - 05/08/18 0001      Modalities   Modalities  Cryotherapy;Electrical Stimulation      Cryotherapy   Number Minutes Cryotherapy  15 Minutes    Cryotherapy Location  Hip    Type of Cryotherapy  Ice pack      Electrical Stimulation   Electrical Stimulation Location  Lt hip, H.S    Electrical Stimulation Action  IFC    Electrical Stimulation Parameters  tolerance 15 min    Electrical Stimulation Goals  Pain             PT Education - 05/08/18 [phone]    Education Details  HEP, TENS, POC    Person(s) Educated  Patient    Methods  Explanation;Demonstration;Verbal cues;Handout    Comprehension  Verbalized understanding;Need further instruction          PT Long Term Goals - 05/08/18 0817      PT LONG TERM GOAL #1   Title  Pt will be I and compliant with HEP. 6 weeks 06/18/18    Status  New      PT LONG TERM GOAL #2   Title  Pt will improve Lt hip strength to 5/5 MMT. 6 weeks 06/18/18    Status  New      PT LONG TERM GOAL #3   Title  Pt will be able to ambulate community distances and up/down stairs without pain or difficulty and with normal gait pattern.  6 weeks 06/18/18      PT LONG TERM GOAL #4   Title  Pt will be able to return to running with less than 2/10 overall pain. 6 weeks 06/18/18             Plan - 05/08/18 0808    Clinical Impression Statement  Pt presents to PT with anterior and posterior Lt hip pain. MRI MRI shows tendinopathy and partial tearing of the left hamstring at its insertion.  There is evidence of ischiofemoral impingement.  Likely torn superior anterior labrum. She has pain and difficulty with prolonged WB activity. She will benefit from skilled PT to address her deficits listed below and return her to full  funciton.    Clinical Presentation  Evolving    Clinical Presentation due to:  worsening pain over last 4 weeks    Clinical Decision Making  Moderate    Rehab Potential  Good    PT Frequency  2x / week    PT Duration  6 weeks    PT Treatment/Interventions  Cryotherapy;Electrical Stimulation;Iontophoresis 4mg /ml Dexamethasone;Moist Heat;Ultrasound;Gait training;Stair training;Therapeutic exercise;Neuromuscular re-education;Manual techniques;Passive range of motion;Dry needling;Taping;Joint Manipulations    PT Next Visit Plan  review HEP, hip stretching and stengthening, consider STM and IASTM to H.S, consider modalites    PT Home Exercise Plan  SLR all planes, sit to stand, standing H.S curl, and SLS    Consulted and Agree with Plan of Care  Patient       Patient will benefit from skilled therapeutic intervention in order to improve the following deficits and impairments:  Abnormal gait, Decreased activity tolerance, Decreased endurance, Decreased range of motion, Decreased strength, Difficulty walking, Impaired flexibility, Increased fascial restricitons, Increased muscle spasms, Pain  Visit Diagnosis: Pain in left hip  Partial hamstring tear, left, subsequent encounter  Tear of left acetabular labrum, subsequent encounter  Difficulty in walking, not elsewhere classified     Problem List Patient Active Problem List   Diagnosis Date Noted  . Pain in right shin 07/04/2016  . Hamstring tendonitis of left thigh 10/03/2015  . Dysfunction of eustachian tube 10/24/2014  . Elevated platelet count 10/24/2014  . Impaired renal function 10/24/2014  . Right foot pain 10/24/2014  . Cyst of ovary 10/12/2012  . Hemorrhage, postmenopausal 10/12/2012  . Knee pain, left 07/23/2012  . Cephalalgia 04/29/2012  . Long term current use of anticoagulant therapy 04/17/2012  . Awareness of heartbeats 04/15/2012  . Deep vein thrombosis (HCC) 04/08/2012  . 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 Allergic rhinitis 01/15/2012  . ABNORMALITY OF GAIT 06/05/2010  . Inversion sprain of right ankle 06/04/2010  . OTHER ACQUIRED DEFORMITY OF ANKLE AND FOOT OTHER 06/07/2009    April Manson, PT, DPT 05/08/2018, 8:22 AM  [facility] [address] [place], [place], [address] Phone: [phone]   Fax:  [phone]  Name: Kim Gentry MRN: [id number] Date of Birth: 1965-12-30

## 2018-05-12 ENCOUNTER — Ambulatory Visit: Payer: 59 | Admitting: Physical Therapy

## 2018-05-12 ENCOUNTER — Encounter: Payer: Self-pay | Admitting: Physical Therapy

## 2018-05-12 DIAGNOSIS — R262 Difficulty in walking, not elsewhere classified: Secondary | ICD-10-CM

## 2018-05-12 DIAGNOSIS — M25552 Pain in left hip: Secondary | ICD-10-CM

## 2018-05-12 DIAGNOSIS — S73192D Other sprain of left hip, subsequent encounter: Secondary | ICD-10-CM

## 2018-05-12 DIAGNOSIS — S76312D Strain of muscle, fascia and tendon of the posterior muscle group at thigh level, left thigh, subsequent encounter: Secondary | ICD-10-CM

## 2018-05-12 NOTE — Therapy (Signed)
[place], [place], [address] Phone: [phone]   Fax:  [phone]  Physical Therapy Treatment  Patient Details  Name: Kim Gentry MRN: [id number] Date of Birth: [birth year] Referring Provider: Weldon Inches DO   Encounter Date: 05/12/2018  PT End of Session - 05/12/18 1410    Visit Number  2    Number of Visits  12    Date for PT Re-Evaluation  06/18/18    Authorization Type  UHC    PT Start Time  9371    PT Stop Time  6967    PT Time Calculation (min)  60 min    Activity Tolerance  Patient limited by pain       Past Medical History:  Diagnosis Date  . DVT (deep venous thrombosis) ([place])   . Stroke [facility])     Past Surgical History:  Procedure Laterality Date  . ABDOMINAL SURGERY    . TONSILLECTOMY      There were no vitals filed for this visit.  Subjective Assessment - 05/12/18 1411    Subjective  Elasia reports she is feeling ok, she feels like the pain is less, using the TENs at home and doing some exercise, did 1 hr on the ellipitcal, also swimming freestyle.     Patient Stated Goals  return to running    Currently in Pain?  Yes    Pain Score  5     Pain Location  Hip    Pain Orientation  Left    Pain Descriptors / Indicators  Sharp    Pain Type  Acute pain    Pain Onset  1 to 4 weeks ago    Pain Frequency  Intermittent    Aggravating Factors   running - taking hard force through the leg    Pain Relieving Factors  TENs,          [facility] PT Assessment - 05/12/18 0001      Assessment   Medical Diagnosis  Lt hip pain, partial labral and H.S tear                   [facility] Adult PT Treatment/Exercise - 05/12/18 0001      Exercises   Exercises  Knee/Hip      Knee/Hip Exercises: Stretches   ITB Stretch  Both;3 reps;30 seconds   cross body with strap    Piriformis Stretch  Left;20 seconds    Other Knee/Hip Stretches  chlds pose after quadraped work    Other Knee/Hip  Stretches  BKTC      Knee/Hip Exercises: Aerobic   Nustep  L4x5' LE only      Knee/Hip Exercises: Standing   SLS  Lt dead lift no wt - performed 2 reps had pain with returning to stand, performed 15 reps Rt toe taps FWD/side/back      Knee/Hip Exercises: Supine   Bridges  Strengthening;10 reps;Both    Single Leg Bridge  Both;2 sets;10 reps   no pain with Lt LE     Knee/Hip Exercises: Sidelying   Clams  2x10 regular and reverse Lt LE with 3# wts      Knee/Hip Exercises: Prone   Other Prone Exercises  quadraped kick backs, attempted Lt LE with red band, had increased pain into HS so stopped and did without resisitance, focusing on eccentric , then HS curls with knee lifted  2x10 each one      Modalities   Modalities  Cryotherapy  Cryotherapy   Number Minutes Cryotherapy  12 Minutes    Cryotherapy Location  --   Lt buttocks and hamstring in prone   Type of Cryotherapy  Ice pack      Electrical Stimulation   Electrical Stimulation Location  --   pt will do at home     Manual Therapy   Manual Therapy  Soft tissue mobilization    Soft tissue mobilization  STM to Lt HS, some tightness about 2" from oigin into semi membranous                  PT Long Term Goals - 05/12/18 1451      PT LONG TERM GOAL #1   Title  Pt will be I and compliant with HEP. 6 weeks 06/18/18    Status  On-going      PT LONG TERM GOAL #2   Title  Pt will improve Lt hip strength to 5/5 MMT. 6 weeks 06/18/18    Status  On-going      PT LONG TERM GOAL #3   Title  Pt will be able to ambulate community distances and up/down stairs without pain or difficulty and with normal gait pattern. 6 weeks 06/18/18    Status  On-going      PT LONG TERM GOAL #4   Title  Pt will be able to return to running with less than 2/10 overall pain. 6 weeks 06/18/18    Status  On-going            Plan - 05/12/18 1451    Clinical Impression Statement  This is Meliah's second visit, she is doing well with  her current HEP and has been performing cardio ex on her own.  She has pain with higher level eccentric exercise of the Lt hamstring.  No goals met however she does reports some decrease in pain with the use of her home TENs.     Rehab Potential  Good    PT Frequency  2x / week    PT Duration  6 weeks    PT Treatment/Interventions  Cryotherapy;Electrical Stimulation;Iontophoresis '4mg'$ /ml Dexamethasone;Moist Heat;Ultrasound;Gait training;Stair training;Therapeutic exercise;Neuromuscular re-education;Manual techniques;Passive range of motion;Dry needling;Taping;Joint Manipulations    PT Next Visit Plan  cont with eccentric hip strengthening, slowly add in single leg activities and core     Consulted and Agree with Plan of Care  Patient       Patient will benefit from skilled therapeutic intervention in order to improve the following deficits and impairments:  Abnormal gait, Decreased activity tolerance, Decreased endurance, Decreased range of motion, Decreased strength, Difficulty walking, Impaired flexibility, Increased fascial restricitons, Increased muscle spasms, Pain  Visit Diagnosis: Pain in left hip  Partial hamstring tear, left, subsequent encounter  Tear of left acetabular labrum, subsequent encounter  Difficulty in walking, not elsewhere classified     Problem List Patient Active Problem List   Diagnosis Date Noted  . Pain in right shin 07/04/2016  . Hamstring tendonitis of left thigh 10/03/2015  . Dysfunction of eustachian tube 10/24/2014  . Elevated platelet count 10/24/2014  . Impaired renal function 10/24/2014  . Right foot pain 10/24/2014  . Cyst of ovary 10/12/2012  . Hemorrhage, postmenopausal 10/12/2012  . Knee pain, left 07/23/2012  . Cephalalgia 04/29/2012  . Long term current use of anticoagulant therapy 04/17/2012  . Awareness of heartbeats 04/15/2012  . Deep vein thrombosis ([place]) 04/08/2012  . Thalamic hemorrhage ([place]) 04/06/2012  . Actinic degeneration  03/18/2012  .  Allergic rhinitis 01/15/2012  . ABNORMALITY OF GAIT 06/05/2010  . Inversion sprain of right ankle 06/04/2010  . OTHER ACQUIRED DEFORMITY OF ANKLE AND FOOT OTHER 06/07/2009    Jeral Pinch PT  05/12/2018, 3:00 PM  [facility] [address] [place], [place], [address] Phone: [phone]   Fax:  [phone]  Name: Kynslei Art MRN: [id number] Date of Birth: [birth year]

## 2018-05-14 ENCOUNTER — Ambulatory Visit: Payer: 59 | Admitting: Sports Medicine

## 2018-05-15 ENCOUNTER — Encounter

## 2018-05-18 ENCOUNTER — Encounter: Payer: Self-pay | Admitting: Physical Therapy

## 2018-05-18 ENCOUNTER — Ambulatory Visit: Payer: 59 | Admitting: Physical Therapy

## 2018-05-18 DIAGNOSIS — S73192D Other sprain of left hip, subsequent encounter: Secondary | ICD-10-CM

## 2018-05-18 DIAGNOSIS — R262 Difficulty in walking, not elsewhere classified: Secondary | ICD-10-CM

## 2018-05-18 DIAGNOSIS — M25552 Pain in left hip: Secondary | ICD-10-CM

## 2018-05-18 DIAGNOSIS — S76312D Strain of muscle, fascia and tendon of the posterior muscle group at thigh level, left thigh, subsequent encounter: Secondary | ICD-10-CM

## 2018-05-18 NOTE — Therapy (Signed)
[place], [place], [address] Phone: [phone]   Fax:  [phone]  Physical Therapy Treatment  Patient Details  Name: Roslin Norwood MRN: [id number] Date of Birth: [birth year] Referring Provider (PT): Weldon Inches DO   Encounter Date: 05/18/2018  PT End of Session - 05/18/18 0934    Visit Number  3    Number of Visits  12    Date for PT Re-Evaluation  06/18/18    Authorization Type  UHC    PT Start Time  0934    PT Stop Time  1023    PT Time Calculation (min)  49 min    Activity Tolerance  Patient tolerated treatment well       Past Medical History:  Diagnosis Date  . DVT (deep venous thrombosis) ([place])   . Stroke [facility])     Past Surgical History:  Procedure Laterality Date  . ABDOMINAL SURGERY    . TONSILLECTOMY      There were no vitals filed for this visit.  Subjective Assessment - 05/18/18 0934    Subjective  Yzabelle reports she stays sore.  She is concerned about doing all her HEP if we add more, ( discussed modifiying HEP instead of adding to it)     Patient Stated Goals  return to running    Currently in Pain?  Yes    Pain Score  5     Pain Location  Buttocks    Pain Orientation  Left    Pain Descriptors / Indicators  Sharp    Pain Type  Acute pain    Pain Onset  More than a month ago    Pain Frequency  Constant    Aggravating Factors   constant    Pain Relieving Factors  TENS                       [facility] Adult PT Treatment/Exercise - 05/18/18 0001      Exercises   Exercises  Knee/Hip      Knee/Hip Exercises: Stretches   Active Hamstring Stretch  Left;3 reps;30 seconds   seated FWD bend     Knee/Hip Exercises: Aerobic   Elliptical  L5x5'    Nustep  --      Knee/Hip Exercises: Sidelying   Other Sidelying Knee/Hip Exercises  3x10 sec side planks      Knee/Hip Exercises: Prone   Hip Extension  Strengthening;Both;3 sets;10 reps   quadruped kick backs, green band  knees   Other Prone Exercises  quadruped firehydrants, green band at knees 3x10      Modalities   Modalities  Cryotherapy      Cryotherapy   Number Minutes Cryotherapy  10 Minutes    Cryotherapy Location  --   prone HS origin/buttock area   Type of Cryotherapy  Ice pack      Manual Therapy   Manual Therapy  Joint mobilization;Manual Traction    Joint Mobilization  Lt hip grade II-III, posterior hip mod, then distraction with stretch into SKTC    Manual Traction  Lt hip traction with stretch into abduction             PT Education - 05/18/18 0959    Education Details  HEP progression    Person(s) Educated  Patient    Methods  Explanation;Demonstration;Handout    Comprehension  Returned demonstration;Verbalized understanding          PT Long Term Goals -  05/18/18 0947      PT LONG TERM GOAL #1   Title  Pt will be I and compliant with HEP. 6 weeks 06/18/18    Status  On-going      PT LONG TERM GOAL #2   Title  Pt will improve Lt hip strength to 5/5 MMT. 6 weeks 06/18/18    Status  On-going      PT LONG TERM GOAL #3   Title  Pt will be able to ambulate community distances and up/down stairs without pain or difficulty and with normal gait pattern. 6 weeks 06/18/18    Baseline  Pt reports that stairs don't bother her    Status  Partially Met      PT LONG TERM GOAL #4   Title  Pt will be able to return to running with less than 2/10 overall pain. 6 weeks 06/18/18    Status  On-going            Plan - 05/18/18 1000    Clinical Impression Statement  [place] continues with pain into her hamsting origin area Lt.  She is a little overwhelmed with all her HEP, the exercises were decreased and progressed today.  She tolerated them well.  She may benefit from ionto to the Lt hamstring origin, she is going to check with her primary MD to see if she is allowed to use this meds.     Rehab Potential  Good    PT Frequency  2x / week    PT Duration  6 weeks    PT  Treatment/Interventions  Cryotherapy;Electrical Stimulation;Iontophoresis 95m/ml Dexamethasone;Moist Heat;Ultrasound;Gait training;Stair training;Therapeutic exercise;Neuromuscular re-education;Manual techniques;Passive range of motion;Dry needling;Taping;Joint Manipulations    PT Next Visit Plan  add ionto if MD ok's cont with eccentric hip strengthening, slowly add in single leg activities and core  = assess response to new HEP    Consulted and Agree with Plan of Care  Patient       Patient will benefit from skilled therapeutic intervention in order to improve the following deficits and impairments:  Abnormal gait, Decreased activity tolerance, Decreased endurance, Decreased range of motion, Decreased strength, Difficulty walking, Impaired flexibility, Increased fascial restricitons, Increased muscle spasms, Pain  Visit Diagnosis: Pain in left hip  Partial hamstring tear, left, subsequent encounter  Tear of left acetabular labrum, subsequent encounter  Difficulty in walking, not elsewhere classified     Problem List Patient Active Problem List   Diagnosis Date Noted  . Pain in right shin 07/04/2016  . Hamstring tendonitis of left thigh 10/03/2015  . Dysfunction of eustachian tube 10/24/2014  . Elevated platelet count 10/24/2014  . Impaired renal function 10/24/2014  . Right foot pain 10/24/2014  . Cyst of ovary 10/12/2012  . Hemorrhage, postmenopausal 10/12/2012  . Knee pain, left 07/23/2012  . Cephalalgia 04/29/2012  . Long term current use of anticoagulant therapy 04/17/2012  . Awareness of heartbeats 04/15/2012  . [place] vein [place] ([place] 04/08/2012  . [place] 04/06/2012  . 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 Allergic rhinitis 01/15/2012  . ABNORMALITY OF GAIT 06/05/2010  . Inversion sprain of right ankle 06/04/2010  . OTHER ACQUIRED DEFORMITY OF ANKLE AND FOOT OTHER 06/07/2009    SBoneta LucksrPT  05/18/2018, 10:16 AM  [facility] [address]Phone: [phone]  Fax:  [phone] Name: MJaquana GeigerMRN: [id number] of Birth: 3December 25, 1967

## 2018-05-19 ENCOUNTER — Ambulatory Visit: Payer: 59 | Admitting: Physical Therapy

## 2018-05-19 ENCOUNTER — Ambulatory Visit: Payer: 59 | Admitting: Sports Medicine

## 2018-05-21 ENCOUNTER — Encounter: Payer: Self-pay | Admitting: Physical Therapy

## 2018-05-21 ENCOUNTER — Ambulatory Visit: Payer: 59 | Attending: Sports Medicine | Admitting: Physical Therapy

## 2018-05-21 DIAGNOSIS — S73192D Other sprain of left hip, subsequent encounter: Secondary | ICD-10-CM

## 2018-05-21 DIAGNOSIS — R262 Difficulty in walking, not elsewhere classified: Secondary | ICD-10-CM | POA: Insufficient documentation

## 2018-05-21 DIAGNOSIS — S76312D Strain of muscle, fascia and tendon of the posterior muscle group at thigh level, left thigh, subsequent encounter: Secondary | ICD-10-CM | POA: Diagnosis present

## 2018-05-21 DIAGNOSIS — M25552 Pain in left hip: Secondary | ICD-10-CM | POA: Diagnosis not present

## 2018-05-21 NOTE — Patient Instructions (Signed)
IONTOPHORESIS PATIENT PRECAUTIONS & CONTRAINDICATIONS:  . Redness under one or both electrodes can occur.  This characterized by a uniform redness that usually disappears within 12 hours of treatment. . Small pinhead size blisters may result in response to the drug.  Contact your physician if the problem persists more than 24 hours. . On rare occasions, iontophoresis therapy can result in temporary skin reactions such as rash, inflammation, irritation or burns.  The skin reactions may be the result of individual sensitivity to the ionic solution used, the condition of the skin at the start of treatment, reaction to the materials in the electrodes, allergies or sensitivity to dexamethasone, or a poor connection between the patch and your skin.  Discontinue using iontophoresis if you have any of these reactions and report to your therapist. . Remove the Patch or electrodes if you have any undue sensation of pain or burning during the treatment and report discomfort to your therapist. . Tell your Therapist if you have had known adverse reactions to the application of electrical current. . Wear the patch for 8 hours, perform a skin check when you take it off and throw it away.  . The Patch can be worn during normal activity, however excessive motion where the electrodes have been placed can cause poor contact between the skin and the electrode or uneven electrical current resulting in greater risk of skin irritation. Marland Kitchen Keep out of the reach of children.   . DO NOT use if you have a known sensitivity to dexamethasone. . DO NOT use during Magnetic Resonance Imaging (MRI). . DO NOT use over broken or compromised skin (e.g. sunburn, cuts, or acne) due to the increased risk of skin reaction. . DO NOT SHAVE over the area to be treated:  To establish good contact between the Patch and the skin, excessive hair may be clipped. . DO NOT place the Patch or electrodes on or over your eyes, directly over your  heart, or brain. . DO NOT reuse the Patch or electrodes as this may cause burns to occur.

## 2018-05-21 NOTE — Therapy (Signed)
[place], [place], [address] Phone: [phone]   Fax:  [phone]  Physical Therapy Treatment  Patient Details  Name: Kim Gentry MRN: [id number] Date of Birth: 1966-04-25 Referring Provider (PT): Weldon Inches DO   Encounter Date: 05/21/2018  PT End of Session - 05/21/18 1058    Visit Number  4    Number of Visits  12    Date for PT Re-Evaluation  06/18/18    Authorization Type  UHC    PT Start Time  9163    PT Stop Time  1139    PT Time Calculation (min)  41 min       Past Medical History:  Diagnosis Date  . DVT (deep venous thrombosis) ([place])   . Stroke [facility])     Past Surgical History:  Procedure Laterality Date  . ABDOMINAL SURGERY    . TONSILLECTOMY      There were no vitals filed for this visit.  Subjective Assessment - 05/21/18 1059    Subjective  Kim Gentry is cleared and ready to try the ionto. No change in her symptoms    Patient Stated Goals  return to running    Currently in Pain?  Yes    Pain Score  5     Pain Location  Buttocks    Pain Orientation  Left    Pain Descriptors / Indicators  Sharp                       [facility] Adult PT Treatment/Exercise - 05/21/18 0001      Exercises   Exercises  Knee/Hip      Knee/Hip Exercises: Stretches   Active Hamstring Stretch  --   figure 4 and cross body with strap   ITB Stretch  Left;2 reps;30 seconds      Knee/Hip Exercises: Aerobic   Elliptical  L6x5'      Knee/Hip Exercises: Standing   SLS  3x10 bilat with kick backs, glut squeeze at end range      Knee/Hip Exercises: Sidelying   Other Sidelying Knee/Hip Exercises  Lt hip,2x 10 reps each pilates FWD/BWD kicks, CW/CCW circles, FWD/BWD toe tap      Modalities   Modalities  Ultrasound;Iontophoresis      Ultrasound   Ultrasound Location  Lt ischial tubersosity    Ultrasound Parameters  50% 1.63mz, 1.0w/cm2    Ultrasound Goals  Pain      Iontophoresis   Type of  Iontophoresis  Dexamethasone    Location  Lt ischial tuberosity    Dose  1.0cc    Time  899mp patch             PT Education - 05/21/18 1116    Education Details  Ionto    Methods  Explanation;Handout    Comprehension  Verbalized understanding          PT Long Term Goals - 05/18/18 0947      PT LONG TERM GOAL #1   Title  Pt will be I and compliant with HEP. 6 weeks 06/18/18    Status  On-going      PT LONG TERM GOAL #2   Title  Pt will improve Lt hip strength to 5/5 MMT. 6 weeks 06/18/18    Status  On-going      PT LONG TERM GOAL #3   Title  Pt will be able to ambulate community distances and up/down stairs without pain or difficulty  and with normal gait pattern. 6 weeks 06/18/18    Baseline  Pt reports that stairs don't bother her    Status  Partially Met      PT LONG TERM GOAL #4   Title  Pt will be able to return to running with less than 2/10 overall pain. 6 weeks 06/18/18    Status  On-going            Plan - 05/21/18 1144    Clinical Impression Statement  Kim Gentry has had no change in pain with current treatment.  She has noticed increased strength in her legs and core.  Therapuetic ultrasound and ionto were added today to see if we can reduce her pain.      Rehab Potential  Good    PT Frequency  2x / week    PT Duration  6 weeks    PT Treatment/Interventions  Cryotherapy;Electrical Stimulation;Iontophoresis 52m/ml Dexamethasone;Moist Heat;Ultrasound;Gait training;Stair training;Therapeutic exercise;Neuromuscular re-education;Manual techniques;Passive range of motion;Dry needling;Taping;Joint Manipulations    PT Next Visit Plan  assess response to [place] ionto and cont    Consulted and Agree with Plan of Care  Patient       Patient will benefit from skilled therapeutic intervention in order to improve the following deficits and impairments:  Abnormal gait, Decreased activity tolerance, Decreased endurance, Decreased range of motion, Decreased strength,  Difficulty walking, Impaired flexibility, Increased fascial restricitons, Increased muscle spasms, Pain  Visit Diagnosis: Pain in left hip  Partial hamstring tear, left, subsequent encounter  Tear of left acetabular labrum, subsequent encounter  Difficulty in walking, not elsewhere classified     Problem List Patient Active Problem List   Diagnosis Date Noted  . Pain in right shin 07/04/2016  . Hamstring tendonitis of left thigh 10/03/2015  . Dysfunction of eustachian tube 10/24/2014  . Elevated platelet count 10/24/2014  . Impaired renal function 10/24/2014  . Right foot pain 10/24/2014  . Cyst of ovary 10/12/2012  . Hemorrhage, postmenopausal 10/12/2012  . Knee pain, left 07/23/2012  . Cephalalgia 04/29/2012  . Long term current use of anticoagulant therapy 04/17/2012  . Awareness of heartbeats 04/15/2012  . Deep vein thrombosis ([place] 04/08/2012  . 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 Allergic rhinitis 01/15/2012  . ABNORMALITY OF GAIT 06/05/2010  . Inversion sprain of right ankle 06/04/2010  . OTHER ACQUIRED DEFORMITY OF ANKLE AND FOOT OTHER 06/07/2009    SJeral PinchPT  05/21/2018, 11:45 AM  [facility] RoadGHazard [place] [address]Phone: [phone]  Fax:  [phone] Name: Kim QuigleyMRN: [id number] of Birth: [birth year]

## 2018-05-26 ENCOUNTER — Other Ambulatory Visit: Payer: Self-pay

## 2018-05-26 ENCOUNTER — Encounter: Payer: Self-pay | Admitting: Physical Therapy

## 2018-05-26 ENCOUNTER — Ambulatory Visit: Payer: 59 | Admitting: Physical Therapy

## 2018-05-26 DIAGNOSIS — M25552 Pain in left hip: Secondary | ICD-10-CM

## 2018-05-26 DIAGNOSIS — S76312D Strain of muscle, fascia and tendon of the posterior muscle group at thigh level, left thigh, subsequent encounter: Secondary | ICD-10-CM

## 2018-05-26 DIAGNOSIS — S73192D Other sprain of left hip, subsequent encounter: Secondary | ICD-10-CM

## 2018-05-26 DIAGNOSIS — R262 Difficulty in walking, not elsewhere classified: Secondary | ICD-10-CM

## 2018-05-26 NOTE — Therapy (Signed)
[place], [place], [address] Phone: [phone]   Fax:  [phone]  Physical Therapy Treatment  Patient Details  Name: Kim Gentry MRN: [id number] Date of Birth: 1966/05/31 Referring Provider (PT): Weldon Inches DO   Encounter Date: 05/26/2018  PT End of Session - 05/26/18 1317    Visit Number  5    Number of Visits  12    Date for PT Re-Evaluation  06/18/18    Authorization Type  UHC    PT Start Time  1143    PT Stop Time  1228    PT Time Calculation (min)  45 min    Activity Tolerance  Patient tolerated treatment well    Behavior During Therapy  [facility] for tasks assessed/performed       Past Medical History:  Diagnosis Date  . DVT (deep venous thrombosis) ([place])   . Stroke [facility])     Past Surgical History:  Procedure Laterality Date  . ABDOMINAL SURGERY    . TONSILLECTOMY      There were no vitals filed for this visit.  Subjective Assessment - 05/26/18 1311    Subjective  Pt. reports 1-2 days of relief from trial ionto as well as ultrasound at last session followed by return of pain.     Limitations  Walking   running   Diagnostic tests  MRI    Patient Stated Goals  return to running    Currently in Pain?  Yes    Pain Score  5     Pain Location  Buttocks    Pain Orientation  Left    Pain Descriptors / Indicators  Sharp    Pain Type  Acute pain    Pain Radiating Towards  no radiating symptoms    Pain Onset  More than a month ago    Aggravating Factors   walking, running    Pain Relieving Factors  rest, TENS    Multiple Pain Sites  No                       [facility] Adult PT Treatment/Exercise - 05/26/18 0001      Exercises   Exercises  Knee/Hip      Knee/Hip Exercises: Stretches   Active Hamstring Stretch  3 reps;30 seconds;Left    ITB Stretch  Left;3 reps;30 seconds      Knee/Hip Exercises: Seated   Other Seated Knee/Hip Exercises  gentle seated hamstring isometric  with RLE resist 1x15   5 second holds     Knee/Hip Exercises: Prone   Hip Extension  --   prone eccentric hamstring curl 2.5 lbs. 2x10     Modalities   Modalities  Iontophoresis;Ultrasound      Ultrasound   Ultrasound Location  Left ischial tuberosity    Ultrasound Parameters  100% 1.0 W/cm2 1 MHZ    Ultrasound Goals  Pain      Iontophoresis   Type of Iontophoresis  Dexamethasone    Location  Lt ischial tuberosity    Dose  1.0cc    Time  43mmp patch      Manual Therapy   Manual Therapy  Myofascial release;Joint mobilization    Joint Mobilization  left hip long axis distraction grade I-III    Soft tissue mobilization  left hamstring             PT Education - 05/26/18 1316    Education Details  HEP updates-added light isometrics  and gentle prone eccentrics    Person(s) Educated  Patient    Methods  Explanation;Demonstration;Tactile cues;Verbal cues    Comprehension  Verbal cues required;Verbalized understanding;Returned demonstration;Tactile cues required          PT Long Term Goals - 05/26/18 1320      PT LONG TERM GOAL #1   Title  Pt will be I and compliant with HEP. 6 weeks 06/18/18    Time  6    Period  Weeks    Status  On-going      PT LONG TERM GOAL #2   Title  Pt will improve Lt hip strength to 5/5 MMT. 6 weeks 06/18/18    Time  6    Period  Weeks    Status  On-going      PT LONG TERM GOAL #3   Title  Pt will be able to ambulate community distances and up/down stairs without pain or difficulty and with normal gait pattern. 6 weeks 06/18/18    Time  6    Period  Weeks    Status  Partially Met      PT LONG TERM GOAL #4   Title  Pt will be able to return to running with less than 2/10 overall pain. 6 weeks 06/18/18    Time  6    Period  Weeks    Status  On-going            Plan - 05/26/18 1317    Clinical Impression Statement  Temporary improvement with addition ionto so continued with this today. Added light eccentrics and isometrics  to promote tendon healing. Fair progress with therapy to date but given etiology symptoms and underlying partial hamstring tear with tendinopathy expect progress will be gradual.    Clinical Presentation  Stable    Clinical Decision Making  Low    Rehab Potential  Fair    Clinical Impairments Affecting Rehab Potential  underlying MRI findings    PT Frequency  2x / week    PT Duration  6 weeks    PT Treatment/Interventions  Cryotherapy;Electrical Stimulation;Iontophoresis 61m/ml Dexamethasone;Moist Heat;Ultrasound;Gait training;Stair training;Therapeutic exercise;Neuromuscular re-education;Manual techniques;Passive range of motion;Dry needling;Taping;Joint Manipulations    PT Next Visit Plan  continue [place] ionto, manual therapy, exercise progression as tolerated    PT Home Exercise Plan  SLR all planes, sit to stand, standing H.S curl, and SLS, added gentle seated isometrics and prone eccentrics    Consulted and Agree with Plan of Care  Patient       Patient will benefit from skilled therapeutic intervention in order to improve the following deficits and impairments:  Abnormal gait, Decreased activity tolerance, Decreased endurance, Decreased range of motion, Decreased strength, Difficulty walking, Impaired flexibility, Increased fascial restricitons, Increased muscle spasms, Pain  Visit Diagnosis: Pain in left hip  Partial hamstring tear, left, subsequent encounter  Tear of left acetabular labrum, subsequent encounter  Difficulty in walking, not elsewhere classified     Problem List Patient Active Problem List   Diagnosis Date Noted  . Pain in right shin 07/04/2016  . Hamstring tendonitis of left thigh 10/03/2015  . Dysfunction of eustachian tube 10/24/2014  . Elevated platelet count 10/24/2014  . Impaired renal function 10/24/2014  . Right foot pain 10/24/2014  . Cyst of ovary 10/12/2012  . Hemorrhage, postmenopausal 10/12/2012  . Knee pain, left 07/23/2012  . Cephalalgia  04/29/2012  . Long term current use of anticoagulant therapy 04/17/2012  . Awareness of heartbeats 04/15/2012  .  Deep vein thrombosis ([place]) 04/08/2012  . 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 Allergic rhinitis 01/15/2012  . ABNORMALITY OF GAIT 06/05/2010  . Inversion sprain of right ankle 06/04/2010  . OTHER ACQUIRED DEFORMITY OF ANKLE AND FOOT OTHER 06/07/2009    Beaulah Dinning, PT, DPT 05/26/18 1:23 PM  [facility] [address] [place], [place], [address] Phone: [phone]   Fax:  [phone]  Name: Kealy Lewter MRN: [id number] Date of Birth: 1965-11-18

## 2018-05-28 ENCOUNTER — Ambulatory Visit: Payer: 59 | Admitting: Physical Therapy

## 2018-05-28 ENCOUNTER — Encounter: Payer: Self-pay | Admitting: Physical Therapy

## 2018-05-28 DIAGNOSIS — S73192D Other sprain of left hip, subsequent encounter: Secondary | ICD-10-CM

## 2018-05-28 DIAGNOSIS — M25552 Pain in left hip: Secondary | ICD-10-CM

## 2018-05-28 DIAGNOSIS — R262 Difficulty in walking, not elsewhere classified: Secondary | ICD-10-CM

## 2018-05-28 DIAGNOSIS — S76312D Strain of muscle, fascia and tendon of the posterior muscle group at thigh level, left thigh, subsequent encounter: Secondary | ICD-10-CM

## 2018-05-28 NOTE — Therapy (Signed)
Kim Gentry, [place], [address] Phone: [phone]   Fax:  [phone]  Physical Therapy Treatment  Patient Details  Name: Kim Gentry MRN: [id number] Date of Birth: [birth year] Referring Provider (PT): Kim Inches DO   Encounter Date: 05/28/2018  PT End of Session - 05/28/18 1109    Visit Number  6    Number of Visits  12    Date for PT Re-Evaluation  06/18/18    Authorization Type  UHC    PT Start Time  1017    PT Stop Time  1058    PT Time Calculation (min)  41 min    Activity Tolerance  Patient tolerated treatment well    Behavior During Therapy  [facility] for tasks assessed/performed       Past Medical History:  Diagnosis Date  . DVT (deep venous thrombosis) ([place])   . Stroke [facility])     Past Surgical History:  Procedure Laterality Date  . ABDOMINAL SURGERY    . TONSILLECTOMY      There were no vitals filed for this visit.  Subjective Assessment - 05/28/18 1105    Subjective  Not as much benefit noted after ionto last session. Continues with proximal hamstring/ischial tuberosity region pain. HEP updates (addition gentle prone eccentric with ankle weight) going OK without increased pain.    Pain Score  4     Pain Location  Buttocks    Pain Orientation  Left    Pain Descriptors / Indicators  Sharp    Pain Radiating Towards  no radiating symptoms    Pain Onset  More than a month ago    Pain Frequency  Constant    Aggravating Factors   walking, running    Pain Relieving Factors  rest, TENS                       [facility] Adult PT Treatment/Exercise - 05/28/18 0001      Knee/Hip Exercises: Stretches   Active Hamstring Stretch  Left;3 reps;30 seconds    ITB Stretch  Left;3 reps;30 seconds      Modalities   Modalities  Iontophoresis;Ultrasound      Ultrasound   Ultrasound Location  Left iscial tuberosity    Ultrasound Parameters  100% 1.0 W/cm1 1 MHZ    Ultrasound Goals  Pain       Iontophoresis   Type of Iontophoresis  Dexamethasone    Location  Lt ischial tuberosity    Dose  1.0 cc    Time  53mmp patch      Manual Therapy   Manual Therapy  Soft tissue mobilization    Soft tissue mobilization  left hamstring             PT Education - 05/28/18 1109    Education Details  HEP, etiology diagnosis and expected healing time    Person(s) Educated  Patient    Methods  Explanation    Comprehension  Verbalized understanding          PT Long Term Goals - 05/26/18 1320      PT LONG TERM GOAL #1   Title  Pt will be I and compliant with HEP. 6 weeks 06/18/18    Time  6    Period  Weeks    Status  On-going      PT LONG TERM GOAL #2   Title  Pt will improve Lt hip strength to 5/5 MMT. 6  weeks 06/18/18    Time  6    Period  Weeks    Status  On-going      PT LONG TERM GOAL #3   Title  Pt will be able to ambulate community distances and up/down stairs without pain or difficulty and with normal gait pattern. 6 weeks 06/18/18    Time  6    Period  Weeks    Status  Partially Met      PT LONG TERM GOAL #4   Title  Pt will be able to return to running with less than 2/10 overall pain. 6 weeks 06/18/18    Time  6    Period  Weeks    Status  On-going            Plan - 05/28/18 1110    Clinical Impression Statement  Fair progress with therapy but given etiology/diagnosis expect progress will be gradual. Mild soreness with IT band stretch and hip bridge otherwise no increased pain.    Rehab Potential  Fair    Clinical Impairments Affecting Rehab Potential  underlying MRI findings    PT Frequency  2x / week    PT Duration  6 weeks    PT Treatment/Interventions  Cryotherapy;Electrical Stimulation;Iontophoresis 78m/ml Dexamethasone;Moist Heat;Ultrasound;Gait training;Stair training;Therapeutic exercise;Neuromuscular re-education;Manual techniques;Passive range of motion;Dry needling;Taping;Joint Manipulations    PT Next Visit Plan  continue [place]  ionto, manual therapy, exercise progression as tolerated    PT Home Exercise Plan  SLR all planes, sit to stand, standing H.S curl, and SLS, gentle seated isometrics and prone eccentrics, hip bridge with eccentric emphasis    Consulted and Agree with Plan of Care  Patient       Patient will benefit from skilled therapeutic intervention in order to improve the following deficits and impairments:  Abnormal gait, Decreased activity tolerance, Decreased endurance, Decreased range of motion, Decreased strength, Difficulty walking, Impaired flexibility, Increased fascial restricitons, Increased muscle spasms, Pain  Visit Diagnosis: Pain in left hip  Partial hamstring tear, left, subsequent encounter  Tear of left acetabular labrum, subsequent encounter  Difficulty in walking, not elsewhere classified     Problem List Patient Active Problem List   Diagnosis Date Noted  . Pain in right shin 07/04/2016  . Hamstring tendonitis of left thigh 10/03/2015  . Dysfunction of eustachian tube 10/24/2014  . Elevated platelet count 10/24/2014  . Impaired renal function 10/24/2014  . Right foot pain 10/24/2014  . Cyst of ovary 10/12/2012  . Hemorrhage, postmenopausal 10/12/2012  . Knee pain, left 07/23/2012  . Cephalalgia 04/29/2012  . Long term current use of anticoagulant therapy 04/17/2012  . Awareness of heartbeats 04/15/2012  . [place] ([place] 04/08/2012  . 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 Allergic rhinitis 01/15/2012  . ABNORMALITY OF GAIT 06/05/2010  . Inversion sprain of right ankle 06/04/2010  . OTHER ACQUIRED DEFORMITY OF ANKLE AND FOOT OTHER 06/07/2009    CBeaulah Gentry PT, DPT 05/28/18 11:51 AM  [facility]GMurray [place] [address]Phone: [phone]  Fax:  [phone] Name: MMiela DesjardinMRN: [id number] of Birth: 302/05/1966

## 2018-06-02 ENCOUNTER — Telehealth: Payer: Self-pay | Admitting: Physical Therapy

## 2018-06-02 ENCOUNTER — Encounter: Payer: Self-pay | Admitting: Physical Therapy

## 2018-06-02 ENCOUNTER — Ambulatory Visit: Payer: 59 | Admitting: Physical Therapy

## 2018-06-02 DIAGNOSIS — S73192D Other sprain of left hip, subsequent encounter: Secondary | ICD-10-CM

## 2018-06-02 DIAGNOSIS — R262 Difficulty in walking, not elsewhere classified: Secondary | ICD-10-CM

## 2018-06-02 DIAGNOSIS — M25552 Pain in left hip: Secondary | ICD-10-CM

## 2018-06-02 DIAGNOSIS — S76312D Strain of muscle, fascia and tendon of the posterior muscle group at thigh level, left thigh, subsequent encounter: Secondary | ICD-10-CM

## 2018-06-02 NOTE — Therapy (Signed)
[place], [place], [address] Phone: [phone]   Fax:  [phone]  Physical Therapy Treatment  Patient Details  Name: Kim Gentry MRN: [id number] Date of Birth: [birth year] Referring Provider (PT): Weldon Inches DO   Encounter Date: 06/02/2018  PT End of Session - 06/02/18 1151    Visit Number  7    Number of Visits  12    Date for PT Re-Evaluation  06/18/18    Authorization Type  UHC    PT Start Time  7867    PT Stop Time  1230    PT Time Calculation (min)  45 min       Past Medical History:  Diagnosis Date  . DVT (deep venous thrombosis) ([place])   . Stroke [facility])     Past Surgical History:  Procedure Laterality Date  . ABDOMINAL SURGERY    . TONSILLECTOMY      There were no vitals filed for this visit.  Subjective Assessment - 06/02/18 1153    Subjective  I do not feel like I am receiving continuity. Everyone gives me something different. I am no better. I am doing the exercises.    Currently in Pain?  No/denies    Pain Score  5     Pain Orientation  Left    Pain Descriptors / Indicators  Sharp    Aggravating Factors   walking, running                        [facility] Adult PT Treatment/Exercise - 06/02/18 0001      Self-Care   Self-Care  Other Self-Care Comments    Other Self-Care Comments   Education on HEP progression, avoidance of pain increase, benefits of US/Ionto/ possible need to F/U with MD if not making progress       Knee/Hip Exercises: Stretches   Active Hamstring Stretch  Left;3 reps;30 seconds      Knee/Hip Exercises: Standing   SLS  single leg squat on left with lean forward- UE assist x 8 before mild pain       Knee/Hip Exercises: Prone   Hip Extension  15 reps   with knee flexed    Other Prone Exercises  5# eccentric only hamstring curls then concentric/eccentric without pain AROM x 10       Ultrasound   Ultrasound Location  Left ischial tuberosity    Ultrasound Parameters  100% 1.2 w/cm2 1 mhz    Ultrasound Goals  Pain      Iontophoresis   Type of Iontophoresis  Dexamethasone   places by patient- per her request   Location  Lt ischial tuberosity    Dose  1.0 cc    Time  20mmp patch                  PT Long Term Goals - 05/26/18 1320      PT LONG TERM GOAL #1   Title  Pt will be I and compliant with HEP. 6 weeks 06/18/18    Time  6    Period  Weeks    Status  On-going      PT LONG TERM GOAL #2   Title  Pt will improve Lt hip strength to 5/5 MMT. 6 weeks 06/18/18    Time  6    Period  Weeks    Status  On-going      PT LONG TERM GOAL #3  Title  Pt will be able to ambulate community distances and up/down stairs without pain or difficulty and with normal gait pattern. 6 weeks 06/18/18    Time  6    Period  Weeks    Status  Partially Met      PT LONG TERM GOAL #4   Title  Pt will be able to return to running with less than 2/10 overall pain. 6 weeks 06/18/18    Time  6    Period  Weeks    Status  On-going            Plan - 06/02/18 1152    Clinical Impression Statement  Pt arrives reporting frustration with continutiy of care recieved as well as lack of progress. Time spent explaining POC, goals, how changes are made to exercise progression based on response and possible need for F/U with MD due to lack of progress. She has one more visit prior to MD visit. Performed prone h/s/ hip extension AROM without increased pain. Began small standing single leg squat with 8 reps prior to " feeling it a little." Repeated US and Ionto. Advised pt that we will assess response to this treatment and will seek MD direction if still no change.     PT Next Visit Plan  continue [place], ionto, manual therapy, exercise progression as tolerated- assess response to last treatment.     PT Home Exercise Plan  SLR all planes, sit to stand, standing H.S curl, and SLS, gentle seated isometrics and prone eccentrics, hip bridge with  eccentric emphasis    Consulted and Agree with Plan of Care  Patient       Patient will benefit from skilled therapeutic intervention in order to improve the following deficits and impairments:  Abnormal gait, Decreased activity tolerance, Decreased endurance, Decreased range of motion, Decreased strength, Difficulty walking, Impaired flexibility, Increased fascial restricitons, Increased muscle spasms, Pain  Visit Diagnosis: Pain in left hip  Partial hamstring tear, left, subsequent encounter  Tear of left acetabular labrum, subsequent encounter  Difficulty in walking, not elsewhere classified     Problem List Patient Active Problem List   Diagnosis Date Noted  . Pain in right shin 07/04/2016  . Hamstring tendonitis of left thigh 10/03/2015  . Dysfunction of eustachian tube 10/24/2014  . Elevated platelet count 10/24/2014  . Impaired renal function 10/24/2014  . Right foot pain 10/24/2014  . Cyst of ovary 10/12/2012  . Hemorrhage, postmenopausal 10/12/2012  . Knee pain, left 07/23/2012  . Cephalalgia 04/29/2012  . Long term current use of anticoagulant therapy 04/17/2012  . Awareness of heartbeats 04/15/2012  . Deep vein thrombosis ([place]) 04/08/2012  . 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 Allergic rhinitis 01/15/2012  . ABNORMALITY OF GAIT 06/05/2010  . Inversion sprain of right ankle 06/04/2010  . OTHER ACQUIRED DEFORMITY OF ANKLE AND FOOT OTHER 06/07/2009    Dorene Ar, PTA 06/02/2018, 1:23 PM  [facility] [address] [place], [place], [address] Phone: [phone]   Fax:  [phone]  Name: Kim Gentry MRN: [id number] Date of Birth: [birth year]

## 2018-06-02 NOTE — Telephone Encounter (Signed)
Called patient to discuss concern with continuity of care. Pt reports it was not a good time and request I call back tomorrow. Committed to call back tomorrow PM. Pt agreeable.

## 2018-06-03 NOTE — Telephone Encounter (Signed)
Left VM requesting patient call me back on my direct line to talk about her concerns.

## 2018-06-04 ENCOUNTER — Encounter: Payer: Self-pay | Admitting: Physical Therapy

## 2018-06-04 ENCOUNTER — Ambulatory Visit: Payer: 59 | Admitting: Physical Therapy

## 2018-06-04 DIAGNOSIS — R262 Difficulty in walking, not elsewhere classified: Secondary | ICD-10-CM

## 2018-06-04 DIAGNOSIS — M25552 Pain in left hip: Secondary | ICD-10-CM | POA: Diagnosis not present

## 2018-06-04 DIAGNOSIS — S73192D Other sprain of left hip, subsequent encounter: Secondary | ICD-10-CM

## 2018-06-04 DIAGNOSIS — S76312D Strain of muscle, fascia and tendon of the posterior muscle group at thigh level, left thigh, subsequent encounter: Secondary | ICD-10-CM

## 2018-06-04 NOTE — Therapy (Signed)
[place], [place], [address] Phone: [phone]   Fax:  [phone]  Physical Therapy Treatment  Patient Details  Name: Kim Gentry MRN: [id number] Date of Birth: [birth year] Referring Provider (PT): Weldon Inches DO   Encounter Date: 06/04/2018  PT End of Session - 06/04/18 1327    Visit Number  8    Number of Visits  12    Date for PT Re-Evaluation  06/18/18    Authorization Type  [employer]    PT Start Time  1059    PT Stop Time  1142    PT Time Calculation (min)  43 min    Activity Tolerance  Patient tolerated treatment well    Behavior During Therapy  [facility] for tasks assessed/performed       Past Medical History:  Diagnosis Date  . DVT (deep venous thrombosis) ([place])   . Stroke [facility])     Past Surgical History:  Procedure Laterality Date  . ABDOMINAL SURGERY    . TONSILLECTOMY      There were no vitals filed for this visit.  Subjective Assessment - 06/04/18 1316    Subjective  Still not having any improvement with therapy to date other than 2 instances of temporary relief with iontophoresis. Continues with left ischial tuberosity region pain 5/10 today. Pt. sees Dr. Oneida Alar next Monday so plan is for her to follow up with him re: status/tx. options.      Limitations  Walking   running   How long can you walk comfortably?  Limited tolerance, no significant improvement from initial report of limited ability without pain past 3-4 steps    Diagnostic tests  MRI    Patient Stated Goals  return to running    Currently in Pain?  Yes    Pain Score  5     Pain Location  Buttocks   Left ischial tuberosity region/proximal hamstring   Pain Orientation  Left    Pain Descriptors / Indicators  Sharp    Pain Type  Acute pain    Pain Onset  More than a month ago    Pain Frequency  Constant    Aggravating Factors   walking, running    Pain Relieving Factors  rest         [facility] PT Assessment - 06/04/18 0001       Observation/Other Assessments   Focus on Therapeutic Outcomes (FOTO)   38% limited      AROM   Overall AROM Comments  Left hip AROM/PROM grossly [facility]      Strength   Left Hip Flexion  4+/5    Left Hip Extension  4+/5    Left Hip External Rotation  5/5    Left Hip Internal Rotation  5/5    Left Hip ABduction  4+/5    Left Hip ADduction  4/5    Right/Left Knee  Right;Left    Right Knee Flexion  5/5    Right Knee Extension  5/5    Left Knee Flexion  5/5    Left Knee Extension  5/5      Flexibility   Soft Tissue Assessment /Muscle Length  --   SLR 90 deg on left                  [facility] Adult PT Treatment/Exercise - 06/04/18 0001      Knee/Hip Exercises: Stretches   Active Hamstring Stretch  --   supine left hamstring manual  stretch 3x30 sec     Knee/Hip Exercises: Prone   Other Prone Exercises  Reviewed HEP to focus on gentle eccentrics, isometrics and stretching for hamstring, other previous HEP exercises and OK core strengthening as long as not provacative of pain      Ultrasound   Ultrasound Location  Left ischial tuberosity    Ultrasound Parameters  100% 1.0 W/cm2    Ultrasound Goals  Pain      Iontophoresis   Type of Iontophoresis  Dexamethasone    Location  Lt ischial tuberosity    Dose  1.0 cc    Time  16mmp patch      Manual Therapy   Manual Therapy  Soft tissue mobilization    Soft tissue mobilization  left hamstring             PT Education - 06/04/18 1327    Education Details  HEP, POC-MD follow up given lack of improvement    Person(s) Educated  Patient    Methods  Explanation    Comprehension  Verbalized understanding          PT Long Term Goals - 06/04/18 1334      PT LONG TERM GOAL #1   Title  Pt will be I and compliant with HEP.     Time  6    Period  Weeks    Status  Achieved      PT LONG TERM GOAL #2   Title  Pt will improve Lt hip strength to 5/5 MMT.     Time  6    Period  Weeks    Status  On-going       PT LONG TERM GOAL #3   Title  Pt will be able to ambulate community distances and up/down stairs without pain or difficulty and with normal gait pattern    Time  6    Period  Weeks    Status  Not Met      PT LONG TERM GOAL #4   Title  Pt will be able to return to running with less than 2/10 overall pain.     Time  6    Period  Weeks    Status  Not Met            Plan - 06/04/18 1328    Clinical Impression Statement  Pt. has attended 8 PT sessions including today's visit. Excepting brief temporary relief with 2 treatments with iontophoresis no signifcant improvement in pain or functional status. Given etiology diagnosis with MRI findings of partial hamstring tear along with tendinopathy expect improvement will take time, however given lack of improvement to date recommend MD follow up for further assessment. Per pt. request we will hold off on formal d/c pending status at MD visit to determine further plan of care.    History and Personal Factors relevant to plan of care:  previous MRI findings    Clinical Presentation  Stable    Clinical Decision Making  Low    Rehab Potential  Fair    Clinical Impairments Affecting Rehab Potential  underlying MRI findings    PT Frequency  --   previous POC 2x/week x 6 weeks-status pending MD visit next week for possible d/c due to lack of progress   PT Duration  --   see above   PT Treatment/Interventions  Cryotherapy;Electrical Stimulation;Iontophoresis 453mml Dexamethasone;Moist Heat;Ultrasound;Gait training;Stair training;Therapeutic exercise;Neuromuscular re-education;Manual techniques;Passive range of motion;Dry needling;Taping;Joint Manipulations    PT Next Visit Plan  MD follow up re: further status/recommendations after today's session, if returning plan continue [place], ionto, STM, manual tx. and pain free exercises    PT Home Exercise Plan  SLR all planes, sit to stand, standing H.S curl, and SLS, gentle seated isometrics and prone eccentrics,  hip bridge with eccentric emphasis    Consulted and Agree with Plan of Care  Patient       Patient will benefit from skilled therapeutic intervention in order to improve the following deficits and impairments:  Abnormal gait, Decreased activity tolerance, Decreased endurance, Decreased range of motion, Decreased strength, Difficulty walking, Impaired flexibility, Increased fascial restricitons, Increased muscle spasms, Pain  Visit Diagnosis: Pain in left hip  Partial hamstring tear, left, subsequent encounter  Tear of left acetabular labrum, subsequent encounter  Difficulty in walking, not elsewhere classified     Problem List Patient Active Problem List   Diagnosis Date Noted  . Pain in right shin 07/04/2016  . Hamstring tendonitis of left thigh 10/03/2015  . Dysfunction of eustachian tube 10/24/2014  . Elevated platelet count 10/24/2014  . Impaired renal function 10/24/2014  . Right foot pain 10/24/2014  . Cyst of ovary 10/12/2012  . Hemorrhage, postmenopausal 10/12/2012  . Knee pain, left 07/23/2012  . Cephalalgia 04/29/2012  . Long term current use of anticoagulant therapy 04/17/2012  . Awareness of heartbeats 04/15/2012  . Deep vein thrombosis ([place]) 04/08/2012  . 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 Allergic rhinitis 01/15/2012  . ABNORMALITY OF GAIT 06/05/2010  . Inversion sprain of right ankle 06/04/2010  . OTHER ACQUIRED DEFORMITY OF ANKLE AND FOOT OTHER 06/07/2009    Beaulah Dinning, PT, DPT 06/04/18 1:37 PM  [facility] [address] [place], [place], [address] Phone: [phone]   Fax:  [phone]  Name: Kim Gentry MRN: [id number] Date of Birth: [birth year]

## 2018-06-05 NOTE — Telephone Encounter (Signed)
Patient seen yesterday by PT in our clinic. Situation appears to be resolved and PT has communicated current status with referring MD office.

## 2018-06-08 ENCOUNTER — Encounter: Payer: Self-pay | Admitting: Sports Medicine

## 2018-06-08 ENCOUNTER — Ambulatory Visit (INDEPENDENT_AMBULATORY_CARE_PROVIDER_SITE_OTHER): Payer: 59 | Admitting: Sports Medicine

## 2018-06-08 DIAGNOSIS — M76892 Other specified enthesopathies of left lower limb, excluding foot: Secondary | ICD-10-CM | POA: Diagnosis not present

## 2018-06-08 MED ORDER — NITROGLYCERIN 0.2 MG/HR TD PT24: MEDICATED_PATCH | TRANSDERMAL | 1 refills | Status: DC

## 2018-06-08 NOTE — Assessment & Plan Note (Signed)
She has another injury to left HS  I don't think they used the right protocol to help her resolve the HS issues Testted in office today on Askling exercises and felt HS on each of these  Will use Askling protocol NTG topically Elliptical for most training but ease into some slow running Reck 6 weeks

## 2018-06-08 NOTE — Patient Instructions (Addendum)
It was a pleasure seeing you today in our clinic. Here is the treatment plan we have discussed and agreed upon together:  1. Please do the Askling exercises provided on the separate sheet 2. Additionally, do hamstring curls  3. Run every other day 50 yards forward (small steps), 10-15 yards backwards. On alternating days use the eliptical.  4.  Nitroglycerin Protocol   Apply 1/4 nitroglycerin patch to affected area daily.  Change position of patch within the affected area every 24 hours.  You may experience a headache during the first 1-2 weeks of using the patch, these should subside.  If you experience headaches after beginning nitroglycerin patch treatment, you may take your preferred over the counter pain reliever.  Another side effect of the nitroglycerin patch is skin irritation or rash related to patch adhesive.  Please notify our office if you develop more severe headaches or rash, and stop the patch.  Tendon healing with nitroglycerin patch may require 12 to 24 weeks depending on the extent of injury.  Men should not use if taking Viagra, Cialis, or Levitra.  Do not use if you have migraines or rosacea.   5. Hamstring compression sleeve. Call our office if you are unable to find yours at home.

## 2018-06-08 NOTE — Progress Notes (Signed)
   HPI  CC: Left hip pain  Patient reports for follow-up of left hip pain.  She was previously seen on 9/19 for review of an MRI which showed partial tearing of the left hamstring at its attachment site of the ischium.  Patient has been following with patient rehab and has undergone 8 treatments.  She reports only minimal improvement in her symptoms.  She is able to swim and elliptical, however is not able to run.  Reports that she attempted to run today, however was forced to stop after 2 minutes due to discomfort.  She reports that she has been given different exercises every time that she has gone to therapy but has not been doing asking exercises yet.  She is open to doing exercises at home.  She has not done Askling protocol for rehab  ROS: Per HPI; in addition no fever, no rash, no additional weakness, no additional numbness, no additional paresthesias, and no additional falls/injury.  No real groin pain during exercise Pain worse at the buttocks crease  Objective: BP 130/80   Ht 5\' 6"  (1.676 m)   Wt 132 lb (59.9 kg)   LMP 03/12/2003 (Approximate)   BMI 21.31 kg/m  Gen: NAD, well groomed, a/o x3, normal affect.  CV: Well-perfused. Warm.  Resp: Non-labored.  Neuro: Sensation intact throughout. No gross coordination deficits.  Gait: Patient takes short steps and limps favoring the right side when running. Hip, left: TTP noted at insertion of the hamstring tendon on superior posterior thigh.at ishcial tuberosity. No obvious rash, erythema, ecchymosis, or edema. ROM full in all directions (IR: 80/ ER: 80/Flex: 120/Ext: 100/Abd: 45/Add: 45); Strength 5/5 in IR/ER/Flex/Ext/Abd/Add.  Greater trochanter without tenderness to palpation.  Note FADIR caused minimal pain  Isolated HS with knee flexion testing - weakness is most significant with foot rotated inward suggesting semimembranosus is primarily involved/ others only mildly weak    Assessment and Plan:  Partial hamstring  tear -patient is very motivated to have her hamstring injury in order to run again.  She will likely be successful in rehabbing this at home.  She has not had much success at therapy, however is been given different exercises every time she is on.  She is interested in doing home exercises. 1. Askling exercises 2. Hamstring curls 3. Nitroglycerin protocol starting with 1/4 patch - warned about issues with migraine and headaches - to stop if this occurs 4. Run on alternating days, slow and small steps 50 yards forward, 10-15 yards backwards to retrain biomechanics of her gait 5. Hamstring compression sleeve - patient believes she has one at home, will call the office if she is unable to find it. 6.  Follow-up in 4 weeks.  Howard Pouch, MD PGY-3 [facility]  I observed and examined the patient with the resident and agree with assessment and plan.  Note reviewed and modified by me. Enid Baas, MD

## 2018-06-09 ENCOUNTER — Ambulatory Visit: Payer: 59 | Admitting: Physical Therapy

## 2018-06-10 NOTE — Therapy (Signed)
[place], [place], [address] Phone: [phone]   Fax:  [phone]  Physical Therapy Treatment/Discharge  Patient Details  Name: Kim Gentry MRN: [id number] Date of Birth: 05-20-1966 Referring Provider (PT): Weldon Inches DO   Encounter Date: 06/04/2018    Past Medical History:  Diagnosis Date  . DVT (deep venous thrombosis) ([place])   . Stroke [facility])     Past Surgical History:  Procedure Laterality Date  . ABDOMINAL SURGERY    . TONSILLECTOMY      There were no vitals filed for this visit.                                 PT Long Term Goals - 06/04/18 1334      PT LONG TERM GOAL #1   Title  Pt will be I and compliant with HEP.     Time  6    Period  Weeks    Status  Achieved      PT LONG TERM GOAL #2   Title  Pt will improve Lt hip strength to 5/5 MMT.     Time  6    Period  Weeks    Status  On-going      PT LONG TERM GOAL #3   Title  Pt will be able to ambulate community distances and up/down stairs without pain or difficulty and with normal gait pattern    Time  6    Period  Weeks    Status  Not Met      PT LONG TERM GOAL #4   Title  Pt will be able to return to running with less than 2/10 overall pain.     Time  6    Period  Weeks    Status  Not Met              Patient will benefit from skilled therapeutic intervention in order to improve the following deficits and impairments:  Abnormal gait, Decreased activity tolerance, Decreased endurance, Decreased range of motion, Decreased strength, Difficulty walking, Impaired flexibility, Increased fascial restricitons, Increased muscle spasms, Pain  Visit Diagnosis: Pain in left hip  Partial hamstring tear, left, subsequent encounter  Tear of left acetabular labrum, subsequent encounter  Difficulty in walking, not elsewhere classified     Problem List Patient Active Problem List   Diagnosis  Date Noted  . Pain in right shin 07/04/2016  . Hamstring tendonitis of left thigh 10/03/2015  . Dysfunction of eustachian tube 10/24/2014  . Elevated platelet count 10/24/2014  . Impaired renal function 10/24/2014  . Right foot pain 10/24/2014  . Cyst of ovary 10/12/2012  . Hemorrhage, postmenopausal 10/12/2012  . Knee pain, left 07/23/2012  . Cephalalgia 04/29/2012  . Long term current use of anticoagulant therapy 04/17/2012  . Awareness of heartbeats 04/15/2012  . Deep vein thrombosis ([place]) 04/08/2012  . 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 Allergic rhinitis 01/15/2012  . ABNORMALITY OF GAIT 06/05/2010  . Inversion sprain of right ankle 06/04/2010  . OTHER ACQUIRED DEFORMITY OF ANKLE AND FOOT OTHER 06/07/2009    PHYSICAL THERAPY DISCHARGE SUMMARY  Visits from Start of Care:8  Current functional level related to goals / functional outcomes: Continues with pain and difficulty running, plan per recent MD visit is to continue via HEP   Remaining deficits: Difficulty running   Education / Equipment: HEP  Plan: Patient agrees to discharge.  Patient goals were not met. Patient is being discharged due to lack of progress.  ?????        Beaulah Dinning, PT, DPT 06/10/18 11:29 AM  [facility] [address] [place], [place], [address] Phone: [phone]   Fax:  [phone]  Name: Kim Gentry MRN: [id number] Date of Birth: 01/24/1966

## 2018-06-11 ENCOUNTER — Ambulatory Visit: Payer: 59 | Admitting: Physical Therapy

## 2018-06-16 ENCOUNTER — Encounter: Payer: 59 | Admitting: Physical Therapy

## 2018-06-18 ENCOUNTER — Encounter: Payer: 59 | Admitting: Physical Therapy

## 2018-06-18 ENCOUNTER — Ambulatory Visit: Payer: 59 | Admitting: Sports Medicine

## 2018-07-21 ENCOUNTER — Encounter: Payer: Self-pay | Admitting: Sports Medicine

## 2018-07-21 ENCOUNTER — Ambulatory Visit (INDEPENDENT_AMBULATORY_CARE_PROVIDER_SITE_OTHER): Payer: 59 | Admitting: Sports Medicine

## 2018-07-21 VITALS — BP 106/80 | Ht 66.0 in | Wt 132.0 lb

## 2018-07-21 DIAGNOSIS — M76892 Other specified enthesopathies of left lower limb, excluding foot: Secondary | ICD-10-CM

## 2018-07-21 NOTE — Assessment & Plan Note (Addendum)
Patient presents for follow-up for left hamstring injury She has been using as needed Tylenol, ice and stretches as prescribed Has been able to run 2 to 3 miles, 2-3 times per week.  This is been morning starts with a three-quarter mile walk.  Plan includes slow increase to mileage as tolerated, by 1/2 mile per exercise episode.  Will also add 5X5 stationary hops per day to add dynamic stress to hamstring.  Patient instructed this can take months to heal fully, but that she is progressing well which she agrees with

## 2018-07-21 NOTE — Progress Notes (Addendum)
   HPI Patient with left hamstring/hip pain since Labor Day.  Imaging confirms left hamstring tendinitis.  She has been steadily improving and is now back to running a few times per week.  She feels she is still slow and occasionally gets pain with full extension of left leg but is tolerating exercise well.  She is following all stretches and medical advice.  No distal deficits sensation changes or noted weakness, she feels she is getting stronger.  CC: left hip/hamstring pain when taking long strides   Medications/Interventions Tried: tylenol ~1/wk, ice, rest, stretches as instructed  See HPI and/or previous note for associated ROS.  Objective: BP 106/80   Ht 5\' 6"  (1.676 m)   Wt 132 lb (59.9 kg)   LMP 03/12/2003 (Approximate)   BMI 21.31 kg/m  Gen: NAD, well groomed, a/o x3, normal affect.  CV: Well-perfused. Warm.  Resp: Non-labored.  Neuro: Sensation intact throughout. No gross coordination deficits. Moves around exam bed with no expressed pain Gait: Nonpathologic posture, unremarkable walking stride without signs of limp or balance issues.  Problems without limp in office, but does have a symmetric stride consistent with left hamstring injury   Assessment and plan:  Hamstring tendonitis of left thigh Patient presents for follow-up for left hamstring injury She has been using as needed Tylenol, ice and stretches as prescribed Has been able to run 2 to 3 miles, 2-3 times per week.  This is been morning starts with a three-quarter mile walk.  Plan includes slow increase to mileage as tolerated, by 1/2 mile per exercise episode.  Will also add 5X5 stationary hops per day to add dynamic stress to hamstring.  Patient instructed this can take months to heal fully, but that she is progressing well which she agrees with   No orders of the defined types were placed in this encounter.   No orders of the defined types were placed in this encounter.    Marthenia RollingScott Trenae Brunke, DO PGY2    07/21/2018 9:52 AM  Addendum Noted good progress in terms of her ability to run but with some residual pain.  Hamstring strength testing  Strong on concentric Weak on eccentric with IR foot (semimembranosus) Running gait shows no obvious limp but delayed pushoff on left leg only.  Overall good progress.  Add dynamic exercises.  Reck in 6 weeks.  I observed and examined the patient with the resident and agree with assessment and plan.  Note reviewed and modified by me. Enid BaasKarl Fields, MD

## 2018-08-20 DIAGNOSIS — F329 Major depressive disorder, single episode, unspecified: Secondary | ICD-10-CM | POA: Diagnosis not present

## 2018-09-03 ENCOUNTER — Encounter: Payer: Self-pay | Admitting: Sports Medicine

## 2018-09-03 ENCOUNTER — Ambulatory Visit: Payer: BLUE CROSS/BLUE SHIELD | Admitting: Sports Medicine

## 2018-09-03 DIAGNOSIS — M76892 Other specified enthesopathies of left lower limb, excluding foot: Secondary | ICD-10-CM

## 2018-09-03 NOTE — Progress Notes (Signed)
F/U Left Hamstring strain  Runs up to 10 miles Still feels pain at buttocks Using tens unit at times Sleeve for thigh Doing exercises and hops + lunges Worst pain now not higher than 3 but there later after run Able to run regularly without limp now  ROS No sciatica No persistent weakness  PE Pleasant F in NAD BP 102/72   Ht 5\' 6"  (1.676 m)   Wt 133 lb (60.3 kg)   LMP 03/12/2003 (Approximate)   BMI 21.47 kg/m   Hamstring strength is good in 3 positions Hip abduction strength is good No palpable defect No TTP over mm belly  Running gait is improved Stride length looks equal No limp today Mild TTP at ischial tuberosity

## 2018-09-03 NOTE — Assessment & Plan Note (Signed)
Strength has really improved Now running consistently and up to 10 miles  Cont use HS sleeve Cont to use static and dynamic HS exercises at least 3 x week  If continued improvement can RTC prn

## 2018-09-17 DIAGNOSIS — M810 Age-related osteoporosis without current pathological fracture: Secondary | ICD-10-CM | POA: Diagnosis not present

## 2018-09-17 DIAGNOSIS — Z79899 Other long term (current) drug therapy: Secondary | ICD-10-CM | POA: Diagnosis not present

## 2018-09-17 DIAGNOSIS — Z5181 Encounter for therapeutic drug level monitoring: Secondary | ICD-10-CM | POA: Diagnosis not present

## 2018-09-28 DIAGNOSIS — F329 Major depressive disorder, single episode, unspecified: Secondary | ICD-10-CM | POA: Diagnosis not present

## 2018-10-25 DIAGNOSIS — M791 Myalgia, unspecified site: Secondary | ICD-10-CM | POA: Diagnosis not present

## 2018-10-25 DIAGNOSIS — H6693 Otitis media, unspecified, bilateral: Secondary | ICD-10-CM | POA: Diagnosis not present

## 2018-11-03 DIAGNOSIS — F329 Major depressive disorder, single episode, unspecified: Secondary | ICD-10-CM | POA: Diagnosis not present

## 2018-11-30 DIAGNOSIS — F329 Major depressive disorder, single episode, unspecified: Secondary | ICD-10-CM | POA: Diagnosis not present

## 2018-12-28 DIAGNOSIS — F329 Major depressive disorder, single episode, unspecified: Secondary | ICD-10-CM | POA: Diagnosis not present

## 2019-01-27 DIAGNOSIS — Z03818 Encounter for observation for suspected exposure to other biological agents ruled out: Secondary | ICD-10-CM | POA: Diagnosis not present

## 2019-01-27 DIAGNOSIS — R05 Cough: Secondary | ICD-10-CM | POA: Diagnosis not present

## 2019-02-05 DIAGNOSIS — M810 Age-related osteoporosis without current pathological fracture: Secondary | ICD-10-CM | POA: Diagnosis not present

## 2019-02-16 DIAGNOSIS — Z03818 Encounter for observation for suspected exposure to other biological agents ruled out: Secondary | ICD-10-CM | POA: Diagnosis not present

## 2019-03-24 DIAGNOSIS — Z5181 Encounter for therapeutic drug level monitoring: Secondary | ICD-10-CM | POA: Diagnosis not present

## 2019-03-24 DIAGNOSIS — Z8262 Family history of osteoporosis: Secondary | ICD-10-CM | POA: Diagnosis not present

## 2019-03-24 DIAGNOSIS — Z79899 Other long term (current) drug therapy: Secondary | ICD-10-CM | POA: Diagnosis not present

## 2019-03-24 DIAGNOSIS — M810 Age-related osteoporosis without current pathological fracture: Secondary | ICD-10-CM | POA: Diagnosis not present

## 2019-04-07 ENCOUNTER — Ambulatory Visit: Payer: Self-pay

## 2019-04-07 ENCOUNTER — Other Ambulatory Visit: Payer: Self-pay

## 2019-04-07 ENCOUNTER — Ambulatory Visit: Payer: BC Managed Care – PPO | Admitting: Family Medicine

## 2019-04-07 ENCOUNTER — Encounter: Payer: Self-pay | Admitting: Family Medicine

## 2019-04-07 VITALS — BP 126/64 | Ht 66.0 in | Wt 132.0 lb

## 2019-04-07 DIAGNOSIS — S76302A Unspecified injury of muscle, fascia and tendon of the posterior muscle group at thigh level, left thigh, initial encounter: Secondary | ICD-10-CM

## 2019-04-07 NOTE — Patient Instructions (Addendum)
You have a proximal hamstring strain. Consider compression shorts though this is a tough area to use compression on. Tylenol as needed for pain. Heat 15 minutes at a time 3-4 times a day - ok to use ice instead if this feels better. Leg curls, hamstring swings - add 2 pound weight with time if these are too easy. 3 sets of 10 once or twice a day. Can add running lunge in 2 weeks if tolerated. Consider physical therapy as well. Follow up with me in 1 month. Ok for swimming, cycling - start with the resistance low and use pain as your guide though.

## 2019-04-07 NOTE — Progress Notes (Signed)
[facility] [address] [place], [place] [address] Phone: [phone] Fax: [phone]   Patient Name: Kim Gentry Date of Birth: 1965/11/29 Medical Record Number: [id number] Gender: female Date of Encounter: 04/07/2019  CC: Left hamstring pain  HPI: Pt is here c/o left hamstring pain. Pain started 2 weeks ago after catching her right foot running and her left leg lunging forward in an effort to brace her from falling. Aggravating factors include running. Alleviating factors include rest and ice. No radiating symptoms. Describes the pain as burning and aching.  Last year patient had a partial tear of the proximal hamstring that required 10 months of conservative rehabilitation.  She feels her leg is still weak when walking and running.  She denies swelling, ecchymosis, radiating pain, numbness, tingling.  Of note, patient has history of hemorrhagic stroke and suffers from headaches when utilizing nitro patches.   Past Medical History:  Diagnosis Date  . DVT (deep venous thrombosis) (HCC)   . Knee pain, left 07/23/2012   I suspect this was some sort of cartilage contusion since it has resolved quickly and her examination and ultrasound are normal today   . Pain in right shin 07/04/2016  . Right foot pain 10/24/2014  . Stroke [facility](HCC)     Current Outpatient Medications on File Prior to Visit  Medication Sig Dispense Refill  . aspirin 325 MG tablet Take 325 mg by mouth.     No current facility-administered medications on file prior to visit.     Past Surgical History:  Procedure Laterality Date  . ABDOMINAL SURGERY    . TONSILLECTOMY      Allergies  Allergen Reactions  . Tetracycline Nausea And Vomiting  . Xarelto [Rivaroxaban]     Social History   Socioeconomic History  . Marital status: Single    Spouse name: Not on file  . Number of children: Not on file  . Years of education: Not on file  . Highest education level: Not on file   Occupational History  . Not on file  Social Needs  . Financial resource strain: Not on file  . Food insecurity    Worry: Not on file    Inability: Not on file  . Transportation needs    Medical: Not on file    Non-medical: Not on file  Tobacco Use  . Smoking status: Former Smoker    Quit date: 08/19/2004    Years since quitting: 14.6  . Smokeless tobacco: Never Used  Substance and Sexual Activity  . Alcohol use: Yes    Alcohol/week: 0.0 standard drinks  . Drug use: Not Currently  . Sexual activity: Not on file  Lifestyle  . Physical activity    Days per week: Not on file    Minutes per session: Not on file  . Stress: Not on file  Relationships  . Social Musicianconnections    Talks on phone: Not on file    Gets together: Not on file    Attends religious service: Not on file    Active member of club or organization: Not on file    Attends meetings of clubs or organizations: Not on file    Relationship status: Not on file  . Intimate partner violence    Fear of current or ex partner: Not on file    Emotionally abused: Not on file    Physically abused: Not on file    Forced sexual activity: Not on file  Other Topics Concern  .  Not on file  Social History Narrative  . Not on file    No family history on file.  BP 126/64   Ht 5\' 6"  (1.676 m)   Wt 132 lb (59.9 kg)   LMP 03/12/2003 (Approximate)   BMI 21.31 kg/m   ROS:  See HPI CONST: no F/C, no malaise, no fatigue MSK: See above NEURO: no numbness/tingling SKIN: no rash, no lesions HEME: no bleeding, no bruising, no erythema  Objective Gen: NAD c normal affect.  CV: Well-perfused. Warm.  Resp: Non-labored.  Neuro: Sensation intact throughout. No gross coordination deficits. Left leg: No swelling or ecchymosis noted proximal hamstring insertion site.  Tenderness to palpation at medial border of ischial tuberosity and less piriformis muscle belly.  Range of motion is full in all directions.   Strength is 4/5 in knee  and 5/5 hip flexion.  Pain with knee flexion at 30 degrees especially.  4/5 in hip abduction Strength is 4/5 in leg extension.  Right leg: No deformity. FROM with 5/5 strength. No tenderness to palpation. NVI distally.  Limited MSK ultrasound: Left hamstring: Ultrasound images in long and short axis to the proximal hamstring tendons show tendons intact with  hypoechoic change and small calcification in proximal medial hamstring tendon.  No evidence of disruption in ischial tuberosity contour.  Assessment and Plan:  1.  Left proximal hamstring strain  Given mechanism and ultrasound findings, consistent with proximal hamstring strain without tear - will treat conservatively as muscle strain with home exercise program and as needed Tylenol.  We will avoid nitro patches due to headaches and anti-inflammatories given hemorrhagic stroke history.  Recommended stationary cycling and swimming.  Patient will follow-up in 1 month or sooner if symptoms worsen.  Lanier Clam, DO, [facility] Sports Medicine Fellow

## 2019-04-08 ENCOUNTER — Encounter: Payer: Self-pay | Admitting: Family Medicine

## 2019-04-17 ENCOUNTER — Encounter: Payer: Self-pay | Admitting: Emergency Medicine

## 2019-04-17 ENCOUNTER — Other Ambulatory Visit: Payer: Self-pay

## 2019-04-17 ENCOUNTER — Emergency Department
Admission: EM | Admit: 2019-04-17 | Discharge: 2019-04-17 | Disposition: A | Discharge status: Home / Self Care | Payer: BC Managed Care – PPO | Source: Home / Self Care | Attending: Family Medicine | Admitting: Family Medicine

## 2019-04-17 DIAGNOSIS — H6982 Other specified disorders of Eustachian tube, left ear: Secondary | ICD-10-CM

## 2019-04-17 DIAGNOSIS — H6692 Otitis media, unspecified, left ear: Secondary | ICD-10-CM

## 2019-04-17 MED ORDER — PREDNISONE 20 MG PO TABS: ORAL_TABLET | ORAL | 0 refills | Status: AC

## 2019-04-17 MED ORDER — AMOXICILLIN 875 MG PO TABS: 875.0000 mg | ORAL_TABLET | Freq: Two times a day (BID) | ORAL | 0 refills | Status: AC

## 2019-04-17 NOTE — Discharge Instructions (Addendum)
°  May use Afrin nasal spray (or generic oxymetazoline) each morning for about 5 days and then discontinue.  Also recommend using saline nasal spray several times daily and saline nasal irrigation (AYR is a common brand).  Use Flonase nasal spray each morning after using Afrin nasal spray and saline nasal irrigation. Stop all antihistamines for now.

## 2019-04-17 NOTE — ED Triage Notes (Signed)
Patient reports soreness of left ear for past 5 days; has taken occasional acetaminophen, but none today. She has not travelled past 4 weeks.

## 2019-04-17 NOTE — ED Provider Notes (Signed)
[facility]    CSN: [id number] Arrival date & time: 04/17/19  1456      History   Chief Complaint Chief Complaint  Patient presents with  . Otalgia    HPI Kim Gentry is a 53 y.o. female.   Patient complains of left earache for 6 days.  Her ear feels clogged, and she cannot equalize pressure.  She denies recent URI, and no fevers, chills, and sweats.  The history is provided by the patient.    Past Medical History:  Diagnosis Date  . DVT (deep venous thrombosis) (HCC)   . Knee pain, left 07/23/2012   I suspect this was some sort of cartilage contusion since it has resolved quickly and her examination and ultrasound are normal today   . Pain in right shin 07/04/2016  . Right foot pain 10/24/2014  . Stroke [facility](HCC)     Patient Active Problem List   Diagnosis Date Noted  . Hamstring tendonitis of left thigh 10/03/2015  . Dysfunction of eustachian tube 10/24/2014  . Elevated platelet count 10/24/2014  . Impaired renal function 10/24/2014  . Cyst of ovary 10/12/2012  . Hemorrhage, postmenopausal 10/12/2012  . Cephalalgia 04/29/2012  . Long term current use of anticoagulant therapy 04/17/2012  . Awareness of heartbeats 04/15/2012  . Deep vein thrombosis (HCC) 04/08/2012  . 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 Allergic rhinitis 01/15/2012  . ABNORMALITY OF GAIT 06/05/2010  . Inversion sprain of right ankle 06/04/2010  . OTHER ACQUIRED DEFORMITY OF ANKLE AND FOOT OTHER 06/07/2009    Past Surgical History:  Procedure Laterality Date  . ABDOMINAL SURGERY    . TONSILLECTOMY      OB History   No obstetric history on file.      Home Medications    Prior to Admission medications   Medication Sig Start Date End Date Taking? Authorizing Provider  denosumab (PROLIA) 60 MG/ML SOSY injection Inject 60 mg into the skin every 6 (six) months.   Yes [provider]  amoxicillin (AMOXIL) 875 MG tablet Take 1 tablet  (875 mg total) by mouth 2 (two) times daily. 04/17/19   Lattie HawBeese, Haivyn Oravec A, MD  aspirin 325 MG tablet Take 325 mg by mouth.    [provider]  predniSONE (DELTASONE) 20 MG tablet Take one tab by mouth twice daily for 4 days, then one daily. Take with food. 04/17/19   Lattie HawBeese, Salli Bodin A, MD    Family History No family history on file.  Social History Social History   Tobacco Use  . Smoking status: Former Smoker    Quit date: 08/19/2004    Years since quitting: 14.6  . Smokeless tobacco: Never Used  Substance Use Topics  . Alcohol use: Yes    Alcohol/week: 0.0 standard drinks  . Drug use: Not Currently     Allergies   Tetracycline and Xarelto [rivaroxaban]   Review of Systems Review of Systems No sore throat No cough No pleuritic pain No wheezing No nasal congestion No post-nasal drainage No sinus pain/pressure No itchy/red eyes + left earache No hemoptysis No SOB No fever/chills No nausea No vomiting No abdominal pain No diarrhea No urinary symptoms No skin rash No fatigue No myalgias No headache   Physical Exam Triage Vital Signs ED Triage Vitals  Enc Vitals Group     BP 04/17/19 1601 (!) 142/81     Pulse Rate 04/17/19 1601 65     Resp 04/17/19 1601 16  Temp 04/17/19 1601 98 F (36.7 C)     Temp Source 04/17/19 1601 Oral     SpO2 04/17/19 1601 99 %     Weight 04/17/19 1603 132 lb 4.4 oz (60 kg)     Height 04/17/19 1603 5\' 6"  (1.676 m)     Head Circumference --      Peak Flow --      Pain Score 04/17/19 1602 3     Pain Loc --      Pain Edu? --      Excl. in GC? --    No data found.  Updated Vital Signs BP (!) 142/81 (BP Location: Right Arm)   Pulse 65   Temp 98 F (36.7 C) (Oral)   Resp 16   Ht 5\' 6"  (1.676 m)   Wt 60 kg   LMP 03/12/2003 (Approximate)   SpO2 99%   BMI 21.35 kg/m   Visual Acuity Right Eye Distance:   Left Eye Distance:   Bilateral Distance:    Right Eye Near:   Left Eye Near:    Bilateral Near:      Physical Exam Nursing notes and Vital Signs reviewed. Appearance:  Patient appears stated age, and in no acute distress Eyes:  Pupils are equal, round, and reactive to light and accomodation.  Extraocular movement is intact.  Conjunctivae are not inflamed  Ears:  Canals normal. Right tympanic membrane appears normal.  Left tympanic membrane has poor landmarks. Nose:  Mildly congested turbinates.  No sinus tenderness.  Pharynx:  Normal Neck:  Supple. No adenopathy.   Lungs:  Clear to auscultation.  Breath sounds are equal.  Moving air well. Heart:  Regular rate and rhythm without murmurs, rubs, or gallops.  Abdomen:  Nontender without masses or hepatosplenomegaly.  Bowel sounds are present.  No CVA or flank tenderness.  Extremities:  No edema.  Skin:  No rash present.    UC Treatments / Results  Labs (all labs ordered are listed, but only abnormal results are displayed) Labs Reviewed -   Tympanometry:  Right ear tympanogram normal; Left ear tympanogram negative peak pressure  EKG   Radiology No results found.  Procedures Procedures (including critical care time)  Medications Ordered in UC Medications - No data to display  Initial Impression / Assessment and Plan / UC Course  I have reviewed the triage vital signs and the nursing notes.  Pertinent labs & imaging results that were available during my care of the patient were reviewed by me and considered in my medical decision making (see chart for details).    Begin amoxicillin and prednisone burst/taper. Followup with ENT if not improved 10 days.   Final Clinical Impressions(s) / UC Diagnoses   Final diagnoses:  Acute left otitis media  Eustachian tube dysfunction, left     Discharge Instructions      May use Afrin nasal spray (or generic oxymetazoline) each morning for about 5 days and then discontinue.  Also recommend using saline nasal spray several times daily and saline nasal irrigation (AYR is a common  brand).  Use Flonase nasal spray each morning after using Afrin nasal spray and saline nasal irrigation. Stop all antihistamines for now.      ED Prescriptions    Medication Sig Dispense Auth. Provider   amoxicillin (AMOXIL) 875 MG tablet Take 1 tablet (875 mg total) by mouth 2 (two) times daily. 20 tablet Lattie Haw, MD   predniSONE (DELTASONE) 20 MG tablet Take one tab by  mouth twice daily for 4 days, then one daily. Take with food. 12 tablet Kandra Nicolas, MD        Kandra Nicolas, MD 04/19/19 [id number]

## 2019-04-19 ENCOUNTER — Telehealth: Payer: Self-pay

## 2019-05-05 ENCOUNTER — Other Ambulatory Visit: Payer: Self-pay

## 2019-05-05 ENCOUNTER — Encounter: Payer: Self-pay | Admitting: Family Medicine

## 2019-05-05 ENCOUNTER — Ambulatory Visit: Payer: BC Managed Care – PPO | Admitting: Family Medicine

## 2019-05-05 VITALS — BP 102/68 | Ht 66.0 in | Wt 132.0 lb

## 2019-05-05 DIAGNOSIS — S76302D Unspecified injury of muscle, fascia and tendon of the posterior muscle group at thigh level, left thigh, subsequent encounter: Secondary | ICD-10-CM | POA: Diagnosis not present

## 2019-05-05 NOTE — Progress Notes (Signed)
PCP: Patient, No Pcp Per  Subjective:   HPI: Patient is a 53 y.o. female here for left hamstring pain.  8/19: Pt is here c/o left hamstring pain. Pain started 2 weeks ago after catching her right foot running and her left leg lunging forward in an effort to brace her from falling. Aggravating factors include running. Alleviating factors include rest and ice. No radiating symptoms. Describes the pain as burning and aching.  Last year patient had a partial tear of the proximal hamstring that required 10 months of conservative rehabilitation.  She feels her leg is still weak when walking and running.  She denies swelling, ecchymosis, radiating pain, numbness, tingling.  Of note, patient has history of hemorrhagic stroke and suffers from headaches when utilizing nitro patches.  9/16: Patient reports she's improving compared to last visit. Doing her home exercises regularly. She took prednisone for a bad ear infection and feels this helped her hamstring quite a bit. She tried doing a spin class 3 weeks ago and had bad pain the day after this. Has been able to swim, use elliptical.  Also jogged 1 mile with her dog 4 times this past week on a flat surface and no increase in pain. No skin changes.  Past Medical History:  Diagnosis Date  . DVT (deep venous thrombosis) ([place])   . Knee pain, left 07/23/2012   I suspect this was some sort of cartilage contusion since it has resolved quickly and her examination and ultrasound are normal today   . Pain in right shin 07/04/2016  . Right foot pain 10/24/2014  . Stroke [facility])     Current Outpatient Medications on File Prior to Visit  Medication Sig Dispense Refill  . aspirin 325 MG tablet Take 325 mg by mouth.    Marland Kitchen amoxicillin (AMOXIL) 875 MG tablet Take 1 tablet (875 mg total) by mouth 2 (two) times daily. (Patient not taking: Reported on 05/05/2019) 20 tablet 0  . denosumab (PROLIA) 60 MG/ML SOSY injection Inject 60 mg into the skin every 6 (six) months.     . predniSONE (DELTASONE) 20 MG tablet Take one tab by mouth twice daily for 4 days, then one daily. Take with food. (Patient not taking: Reported on 05/05/2019) 12 tablet 0   No current facility-administered medications on file prior to visit.     Past Surgical History:  Procedure Laterality Date  . ABDOMINAL SURGERY    . TONSILLECTOMY      Allergies  Allergen Reactions  . Tetracycline Nausea And Vomiting  . Xarelto [Rivaroxaban]     Social History   Socioeconomic History  . Marital status: Single    Spouse name: Not on file  . Number of children: Not on file  . Years of education: Not on file  . Highest education level: Not on file  Occupational History  . Not on file  Social Needs  . Financial resource strain: Not on file  . Food insecurity    Worry: Not on file    Inability: Not on file  . Transportation needs    Medical: Not on file    Non-medical: Not on file  Tobacco Use  . Smoking status: Former Smoker    Quit date: 08/19/2004    Years since quitting: 14.7  . Smokeless tobacco: Never Used  Substance and Sexual Activity  . Alcohol use: Yes    Alcohol/week: 0.0 standard drinks  . Drug use: Not Currently  . Sexual activity: Not on file  Lifestyle  .  Physical activity    Days per week: Not on file    Minutes per session: Not on file  . Stress: Not on file  Relationships  . Social Musicianconnections    Talks on phone: Not on file    Gets together: Not on file    Attends religious service: Not on file    Active member of club or organization: Not on file    Attends meetings of clubs or organizations: Not on file    Relationship status: Not on file  . Intimate partner violence    Fear of current or ex partner: Not on file    Emotionally abused: Not on file    Physically abused: Not on file    Forced sexual activity: Not on file  Other Topics Concern  . Not on file  Social History Narrative  . Not on file    History reviewed. No pertinent family  history.  BP 102/68   Ht 5\' 6"  (1.676 m)   Wt 132 lb (59.9 kg)   LMP 03/12/2003 (Approximate)   BMI 21.31 kg/m   Review of Systems: See HPI above.     Objective:  Physical Exam:  Gen: NAD, comfortable in exam room  Left hip/leg: No gross deformity, ecchymoses, swelling, instability. Mild TTP proximal hamstring tendon, less over piriformis. FROM with 5/5 strength but pain with knee flexion at 30 and 90 degrees. NV intact distally. Negative logroll, fabers, fadir.   Assessment & Plan:  1. Left hamstring strain - proximal with tendinopathy.  She is improving - encouraged to continue with home exercise program.  Tylenol only if needed.  Avoid hills, jumping activities for 4-6 weeks.  Jog/run every other day and increase weekly by 1/2 mile at a time.  F/u in 1 month or prn.

## 2019-05-05 NOTE — Patient Instructions (Signed)
You're doing great! Continue the home exercises as you have been until pain resolves. Tylenol only if needed. Run every other day, increase by 1/2 mile as we discussed weekly until you get to your goal. Avoid hills as much as possible. Wait 4-6 weeks before returning to any jumping activities, spin class and do about 50% of what you would normally do to ease back into these activities if you want to do them. Follow up with me in 1 month or as needed.

## 2019-05-06 ENCOUNTER — Ambulatory Visit: Payer: BLUE CROSS/BLUE SHIELD | Admitting: Sports Medicine

## 2019-05-06 DIAGNOSIS — Z79899 Other long term (current) drug therapy: Secondary | ICD-10-CM | POA: Diagnosis not present

## 2019-05-06 DIAGNOSIS — M8580 Other specified disorders of bone density and structure, unspecified site: Secondary | ICD-10-CM | POA: Diagnosis not present

## 2019-05-06 DIAGNOSIS — J45990 Exercise induced bronchospasm: Secondary | ICD-10-CM | POA: Diagnosis not present

## 2019-05-06 DIAGNOSIS — Z13220 Encounter for screening for lipoid disorders: Secondary | ICD-10-CM | POA: Diagnosis not present

## 2019-05-06 DIAGNOSIS — J3089 Other allergic rhinitis: Secondary | ICD-10-CM | POA: Diagnosis not present

## 2019-05-06 DIAGNOSIS — Z0000 Encounter for general adult medical examination without abnormal findings: Secondary | ICD-10-CM | POA: Diagnosis not present

## 2019-05-11 DIAGNOSIS — Z01419 Encounter for gynecological examination (general) (routine) without abnormal findings: Secondary | ICD-10-CM | POA: Diagnosis not present

## 2019-05-11 DIAGNOSIS — Z1151 Encounter for screening for human papillomavirus (HPV): Secondary | ICD-10-CM | POA: Diagnosis not present

## 2019-05-11 DIAGNOSIS — Z803 Family history of malignant neoplasm of breast: Secondary | ICD-10-CM | POA: Diagnosis not present

## 2019-05-11 DIAGNOSIS — N952 Postmenopausal atrophic vaginitis: Secondary | ICD-10-CM | POA: Diagnosis not present

## 2019-05-11 DIAGNOSIS — Z124 Encounter for screening for malignant neoplasm of cervix: Secondary | ICD-10-CM | POA: Diagnosis not present

## 2019-05-11 DIAGNOSIS — Z1231 Encounter for screening mammogram for malignant neoplasm of breast: Secondary | ICD-10-CM | POA: Diagnosis not present

## 2019-05-19 DIAGNOSIS — E0789 Other specified disorders of thyroid: Secondary | ICD-10-CM | POA: Diagnosis not present

## 2019-05-21 DIAGNOSIS — Z8669 Personal history of other diseases of the nervous system and sense organs: Secondary | ICD-10-CM | POA: Diagnosis not present

## 2019-05-21 DIAGNOSIS — H43393 Other vitreous opacities, bilateral: Secondary | ICD-10-CM | POA: Diagnosis not present

## 2019-05-21 DIAGNOSIS — H04123 Dry eye syndrome of bilateral lacrimal glands: Secondary | ICD-10-CM | POA: Diagnosis not present

## 2019-05-21 DIAGNOSIS — H2513 Age-related nuclear cataract, bilateral: Secondary | ICD-10-CM | POA: Diagnosis not present

## 2019-06-01 DIAGNOSIS — L858 Other specified epidermal thickening: Secondary | ICD-10-CM | POA: Diagnosis not present

## 2019-06-01 DIAGNOSIS — L720 Epidermal cyst: Secondary | ICD-10-CM | POA: Diagnosis not present

## 2019-06-01 DIAGNOSIS — Z79899 Other long term (current) drug therapy: Secondary | ICD-10-CM | POA: Diagnosis not present

## 2019-06-01 DIAGNOSIS — L820 Inflamed seborrheic keratosis: Secondary | ICD-10-CM | POA: Diagnosis not present

## 2019-06-01 DIAGNOSIS — D1801 Hemangioma of skin and subcutaneous tissue: Secondary | ICD-10-CM | POA: Diagnosis not present

## 2019-06-01 DIAGNOSIS — L814 Other melanin hyperpigmentation: Secondary | ICD-10-CM | POA: Diagnosis not present

## 2019-06-01 DIAGNOSIS — L821 Other seborrheic keratosis: Secondary | ICD-10-CM | POA: Diagnosis not present

## 2019-06-07 ENCOUNTER — Ambulatory Visit: Payer: BC Managed Care – PPO | Admitting: Family Medicine

## 2019-06-08 DIAGNOSIS — I10 Essential (primary) hypertension: Secondary | ICD-10-CM | POA: Diagnosis not present

## 2019-06-08 DIAGNOSIS — I610 Nontraumatic intracerebral hemorrhage in hemisphere, subcortical: Secondary | ICD-10-CM | POA: Diagnosis not present

## 2019-06-08 DIAGNOSIS — G441 Vascular headache, not elsewhere classified: Secondary | ICD-10-CM | POA: Diagnosis not present

## 2019-06-23 DIAGNOSIS — Z87891 Personal history of nicotine dependence: Secondary | ICD-10-CM | POA: Diagnosis not present

## 2019-06-23 DIAGNOSIS — I671 Cerebral aneurysm, nonruptured: Secondary | ICD-10-CM | POA: Diagnosis not present

## 2019-09-30 DIAGNOSIS — Z5181 Encounter for therapeutic drug level monitoring: Secondary | ICD-10-CM | POA: Diagnosis not present

## 2019-09-30 DIAGNOSIS — Z79899 Other long term (current) drug therapy: Secondary | ICD-10-CM | POA: Diagnosis not present

## 2019-09-30 DIAGNOSIS — M810 Age-related osteoporosis without current pathological fracture: Secondary | ICD-10-CM | POA: Diagnosis not present

## 2019-11-10 DIAGNOSIS — Z23 Encounter for immunization: Secondary | ICD-10-CM | POA: Diagnosis not present

## 2019-11-30 DIAGNOSIS — Z23 Encounter for immunization: Secondary | ICD-10-CM | POA: Diagnosis not present

## 2020-03-29 DIAGNOSIS — Z79899 Other long term (current) drug therapy: Secondary | ICD-10-CM | POA: Diagnosis not present

## 2020-03-29 DIAGNOSIS — Z5181 Encounter for therapeutic drug level monitoring: Secondary | ICD-10-CM | POA: Diagnosis not present

## 2020-03-29 DIAGNOSIS — M810 Age-related osteoporosis without current pathological fracture: Secondary | ICD-10-CM | POA: Diagnosis not present

## 2020-03-29 DIAGNOSIS — Z8262 Family history of osteoporosis: Secondary | ICD-10-CM | POA: Diagnosis not present

## 2020-05-09 DIAGNOSIS — M8580 Other specified disorders of bone density and structure, unspecified site: Secondary | ICD-10-CM | POA: Diagnosis not present

## 2020-05-09 DIAGNOSIS — Z79899 Other long term (current) drug therapy: Secondary | ICD-10-CM | POA: Diagnosis not present

## 2020-05-09 DIAGNOSIS — Z0000 Encounter for general adult medical examination without abnormal findings: Secondary | ICD-10-CM | POA: Diagnosis not present

## 2020-05-09 DIAGNOSIS — Z13220 Encounter for screening for lipoid disorders: Secondary | ICD-10-CM | POA: Diagnosis not present

## 2020-05-09 DIAGNOSIS — J45990 Exercise induced bronchospasm: Secondary | ICD-10-CM | POA: Diagnosis not present

## 2020-05-11 DIAGNOSIS — Z1231 Encounter for screening mammogram for malignant neoplasm of breast: Secondary | ICD-10-CM | POA: Diagnosis not present

## 2020-05-26 DIAGNOSIS — R590 Localized enlarged lymph nodes: Secondary | ICD-10-CM | POA: Diagnosis not present

## 2020-05-26 DIAGNOSIS — R221 Localized swelling, mass and lump, neck: Secondary | ICD-10-CM | POA: Diagnosis not present

## 2020-06-02 DIAGNOSIS — Z7189 Other specified counseling: Secondary | ICD-10-CM | POA: Diagnosis not present

## 2020-06-02 DIAGNOSIS — L578 Other skin changes due to chronic exposure to nonionizing radiation: Secondary | ICD-10-CM | POA: Diagnosis not present

## 2020-06-02 DIAGNOSIS — D1801 Hemangioma of skin and subcutaneous tissue: Secondary | ICD-10-CM | POA: Diagnosis not present

## 2020-06-02 DIAGNOSIS — D485 Neoplasm of uncertain behavior of skin: Secondary | ICD-10-CM | POA: Diagnosis not present

## 2020-06-02 DIAGNOSIS — Z79899 Other long term (current) drug therapy: Secondary | ICD-10-CM | POA: Diagnosis not present

## 2020-06-02 DIAGNOSIS — L309 Dermatitis, unspecified: Secondary | ICD-10-CM | POA: Diagnosis not present

## 2020-06-02 DIAGNOSIS — L821 Other seborrheic keratosis: Secondary | ICD-10-CM | POA: Diagnosis not present

## 2020-06-02 DIAGNOSIS — L814 Other melanin hyperpigmentation: Secondary | ICD-10-CM | POA: Diagnosis not present

## 2020-07-21 DIAGNOSIS — S99922A Unspecified injury of left foot, initial encounter: Secondary | ICD-10-CM | POA: Diagnosis not present

## 2020-07-26 IMAGING — DX DG HIP (WITH OR WITHOUT PELVIS) 2-3V*L*
2 series · 2 of 2 positions shown · non-contrast
Comparison: None.

CLINICAL DATA: LEFT hip pain for 2 weeks, no known injury.

EXAM:
DG HIP (WITH OR WITHOUT PELVIS) 2-3V LEFT

[dg hip unilat w or w/o pelvis 2-3 views  (1 of 2)]
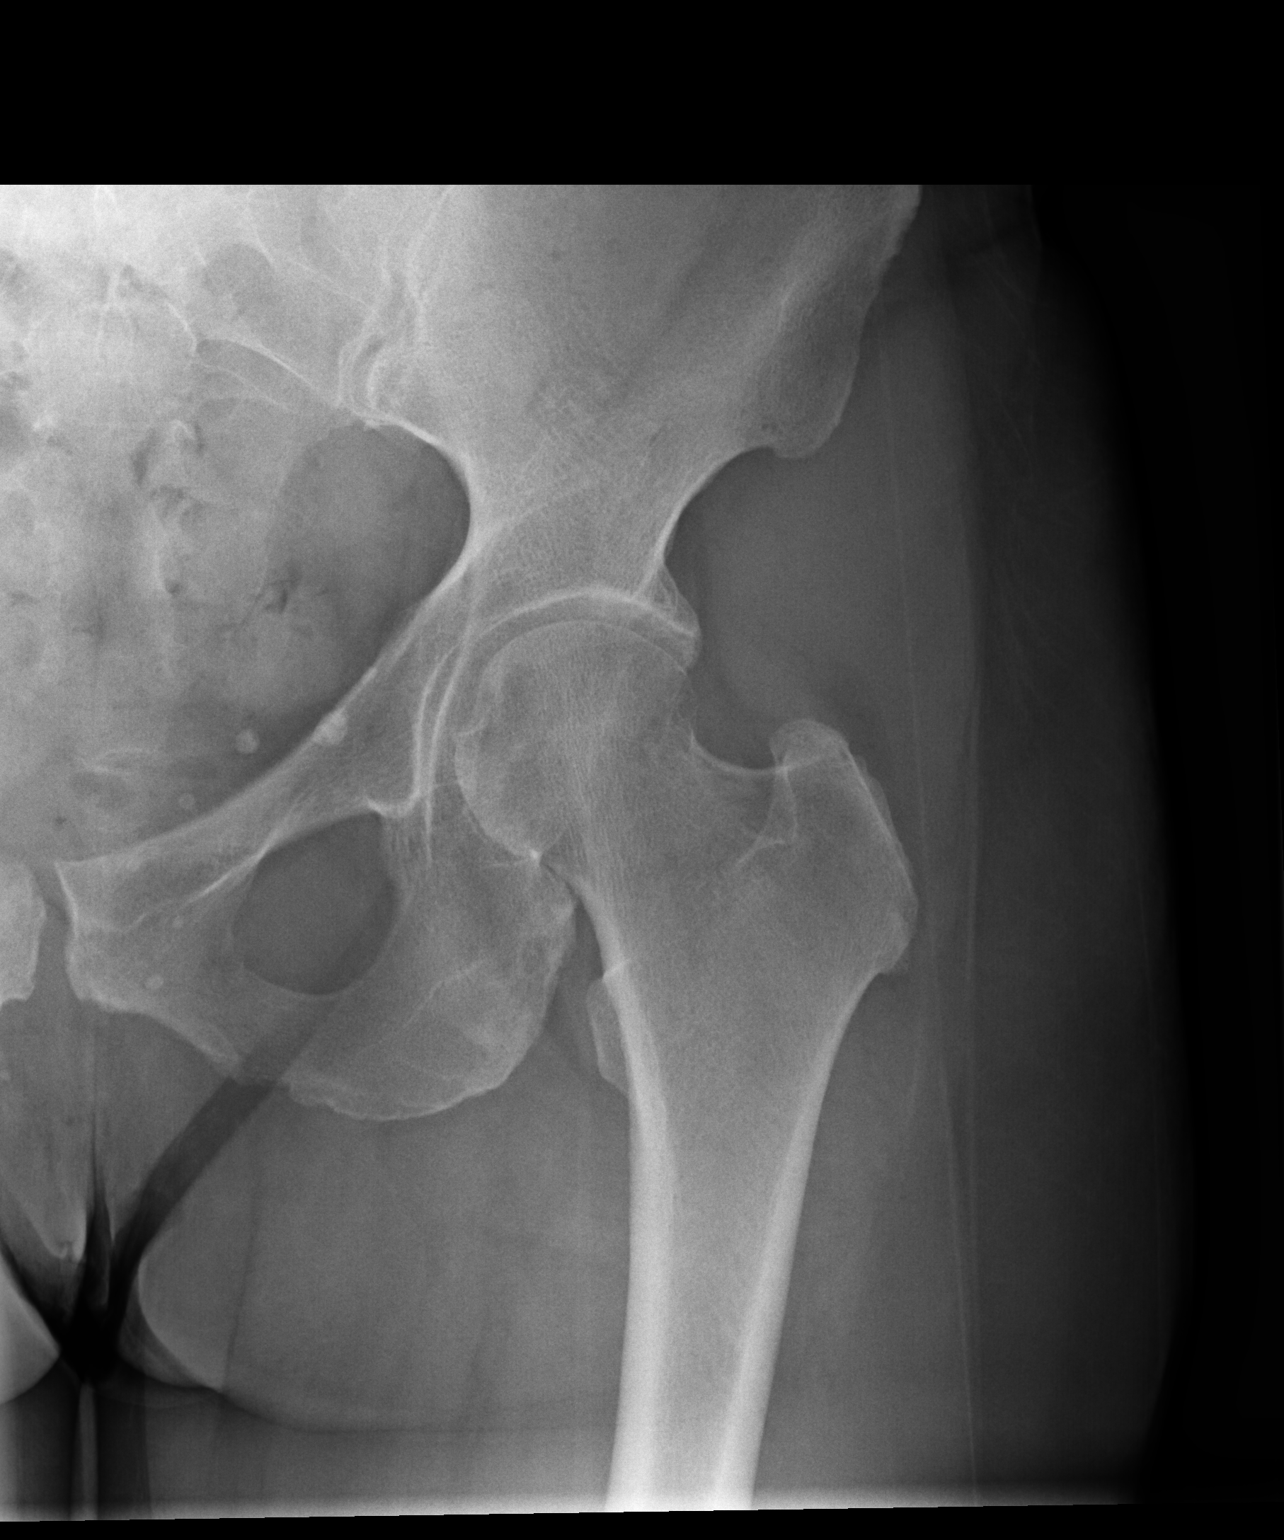

[dg hip unilat w or w/o pelvis 2-3 views  (2 of 2)]
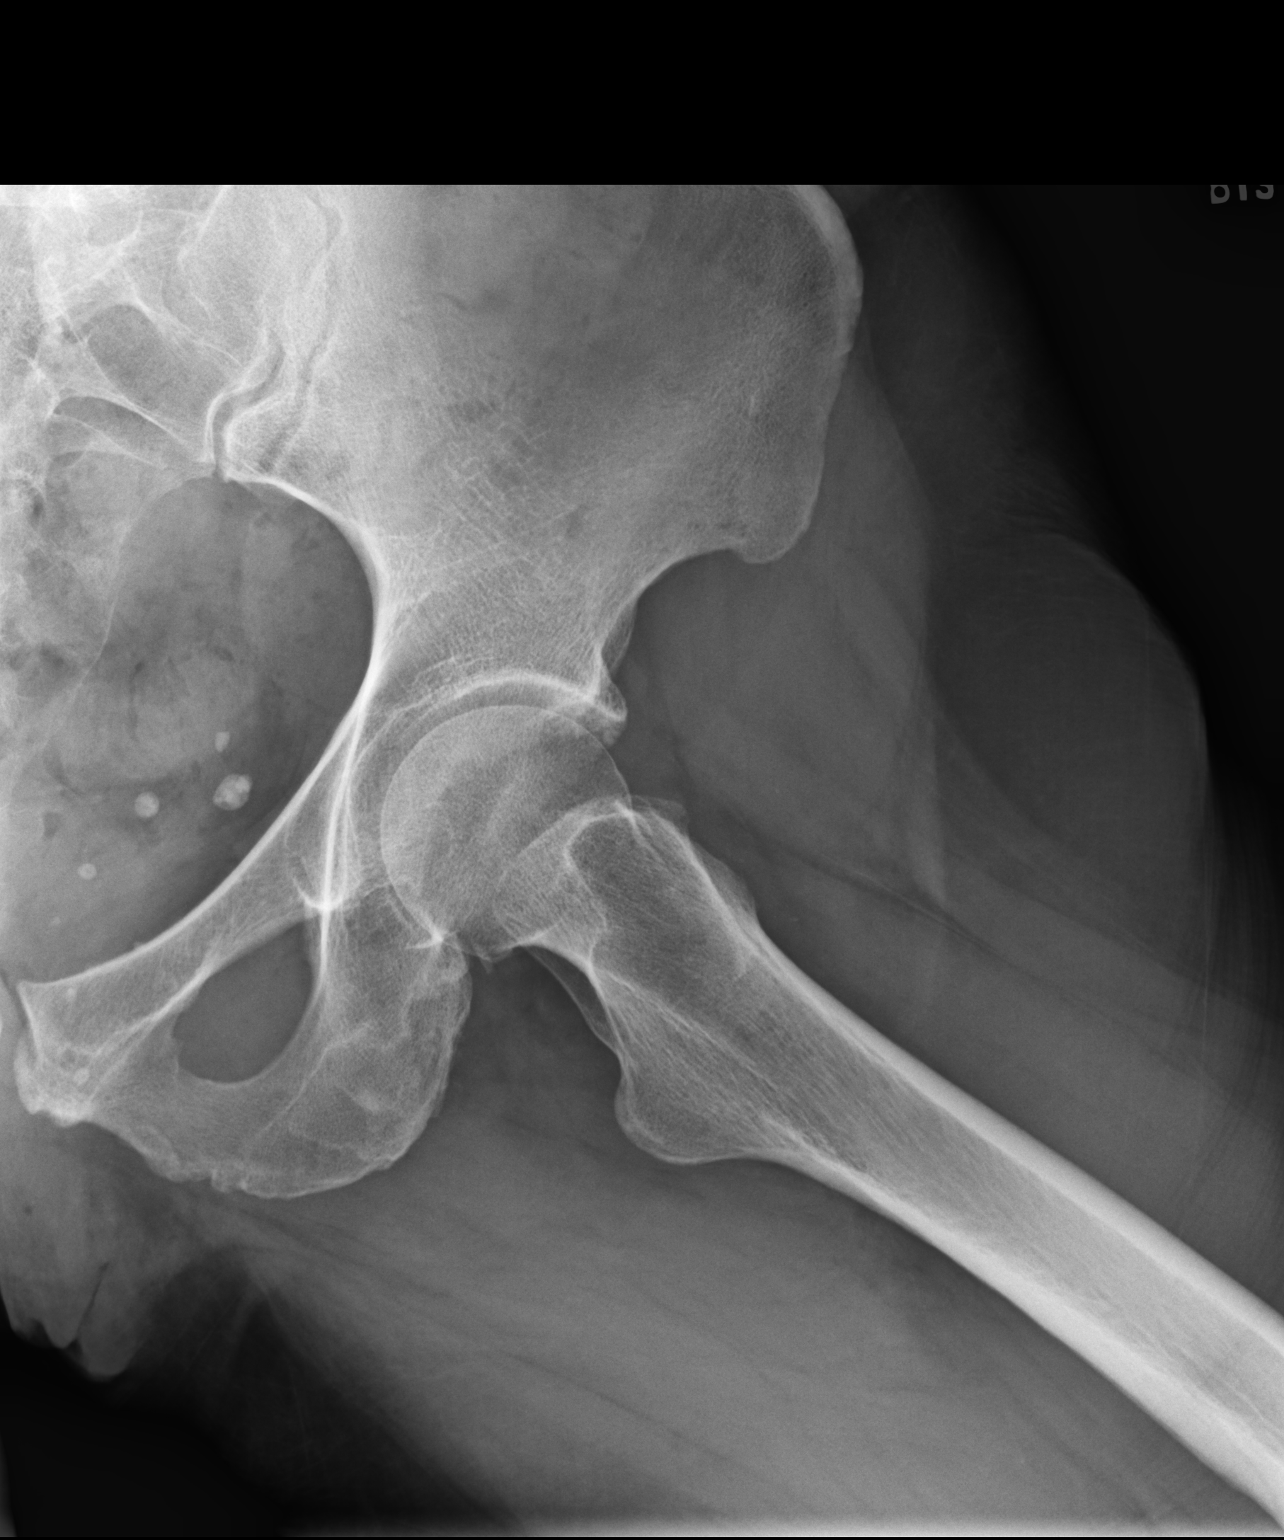

[2 of 2 positions shown; findings below may reference images not displayed]

FINDINGS: Osseous alignment is normal. Bone mineralization is within normal
limits. No fracture line or displaced fracture fragment. No acute or
suspicious osseous lesion. No appreciable degenerative change at the
LEFT hip joint. Soft tissues about the LEFT hip are unremarkable.
IMPRESSION: Negative.

## 2020-07-29 IMAGING — MR MR HIP*L* W/O CM
5 series · 40 of 40 positions shown · non-contrast
Comparison: Radiographs 04/23/2018

CLINICAL DATA: Left hip after running 3 weeks ago.

EXAM:
MR OF THE LEFT HIP WITHOUT CONTRAST
TECHNIQUE: Multiplanar, multisequence MR imaging was performed. No intravenous
contrast was administered.

[Series 3: T1 · coronal · 4.0mm · 1.19mm/px · 9 of 24 slices shown]
[im 1/24]
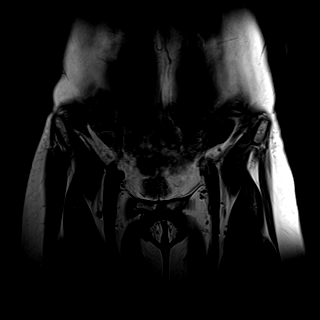
[im 3/24]
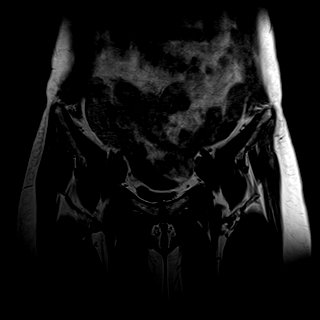
[im 6/24]
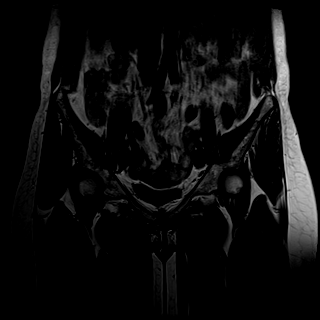
[im 9/24]
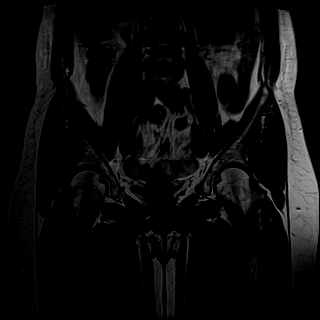
[im 12/24]
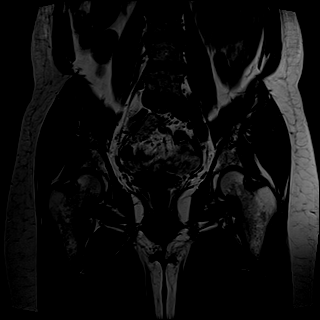
[im 15/24]
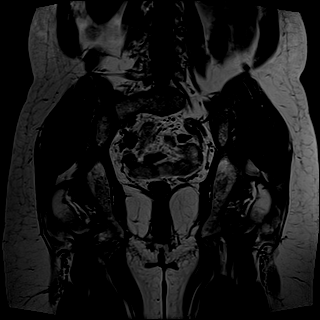
[im 18/24]
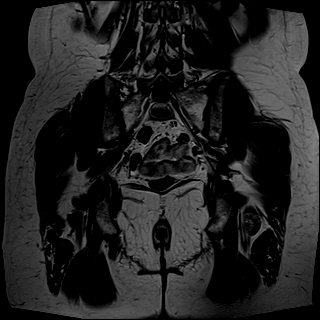
[im 21/24]
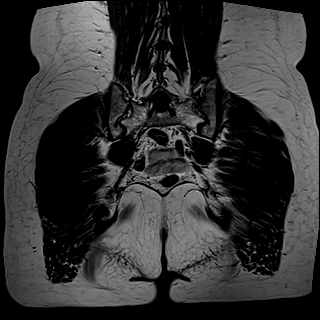
[im 24/24]
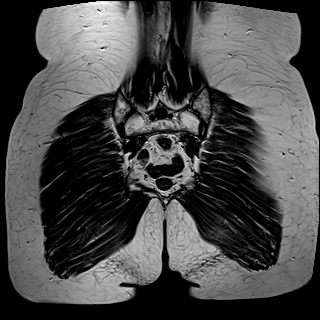

[Series 4: T2 fat-sat · coronal · 4.0mm · 1.19mm/px · 8 of 24 slices shown (1 of 2)]
[im 1/24]
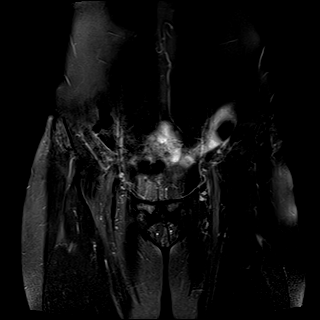
[im 4/24]
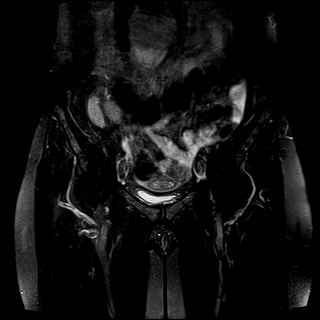
[im 7/24]
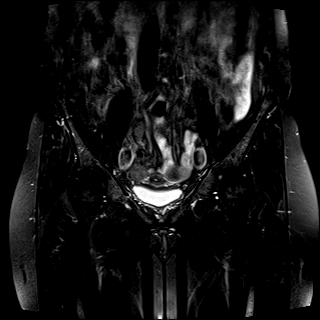
[im 10/24]
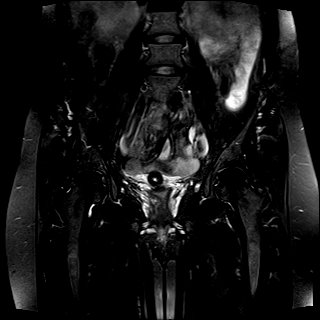
[im 14/24]
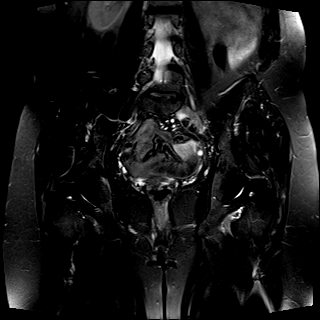
[im 17/24]
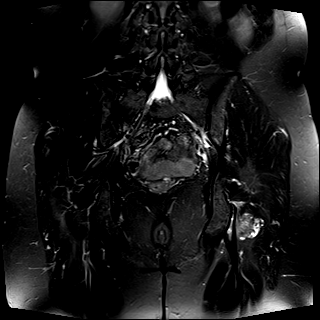
[im 20/24]
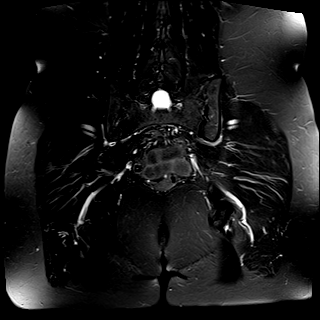
[im 24/24]
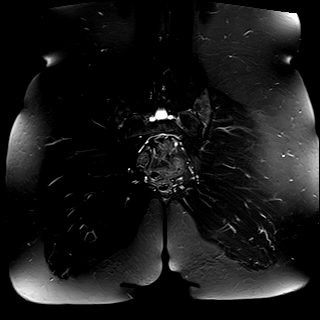

[Series 5: T2 fat-sat · axial · 4.0mm · 0.35mm/px · z∈[-135,-20]mm · 8 of 24 slices shown (2 of 2)]
[im 1/24]
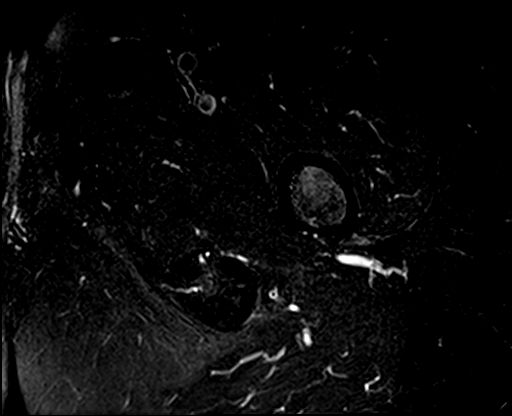
[im 4/24]
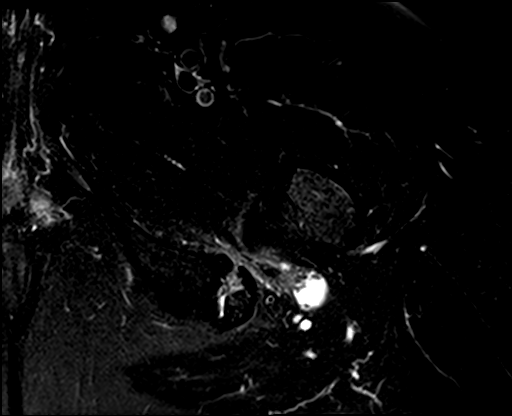
[im 7/24]
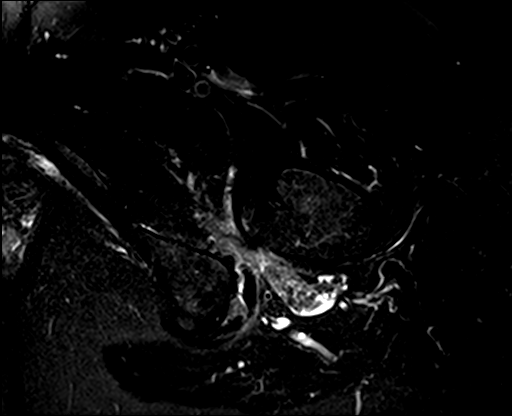
[im 10/24]
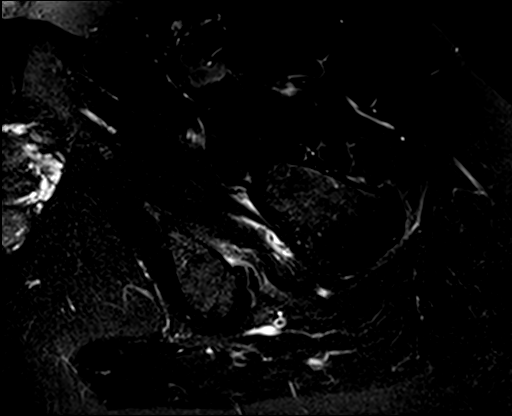
[im 14/24]
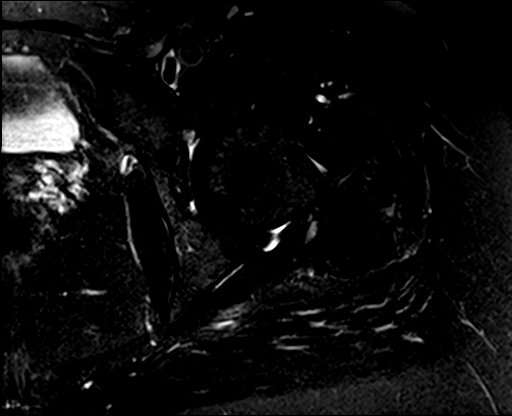
[im 17/24]
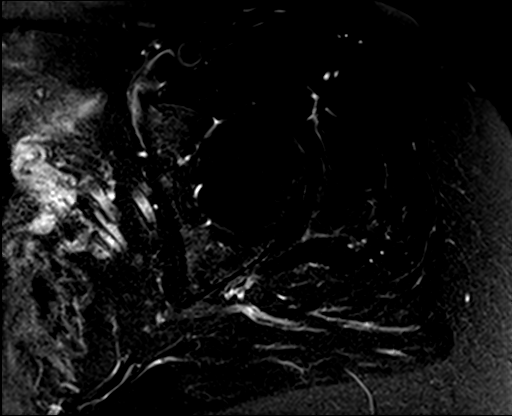
[im 20/24]
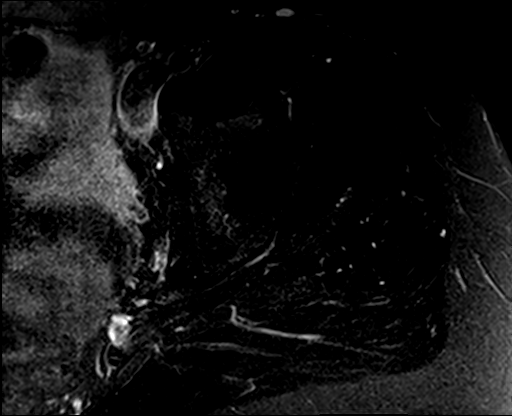
[im 24/24]
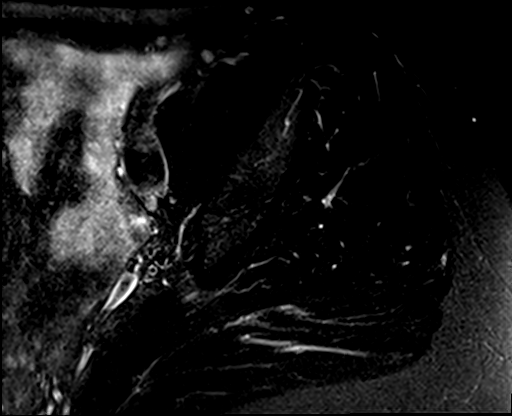

[Series 6: PD fat-sat · sagittal · 4.0mm · 0.70mm/px · 8 of 22 slices shown (1 of 2)]
[im 1/22]
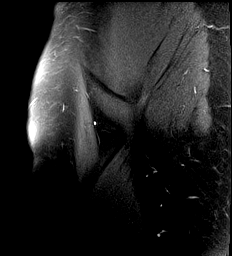
[im 4/22]
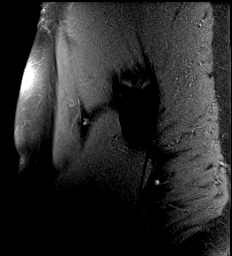
[im 7/22]
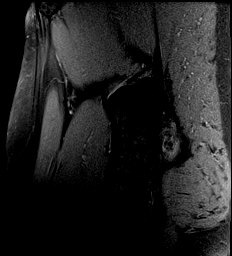
[im 10/22]
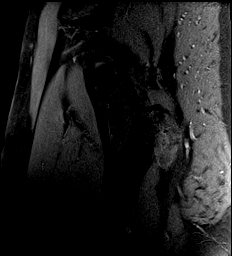
[im 13/22]
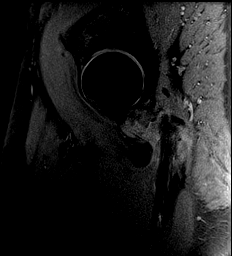
[im 16/22]
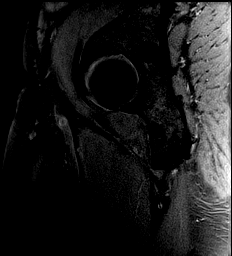
[im 19/22]
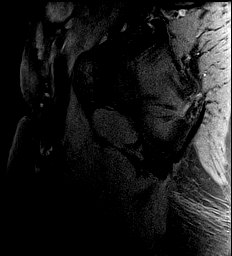
[im 22/22]
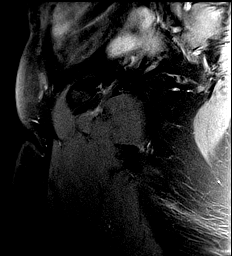

[Series 7: PD fat-sat · coronal · 4.0mm · 0.70mm/px · 7 of 19 slices shown (2 of 2)]
[im 1/19]
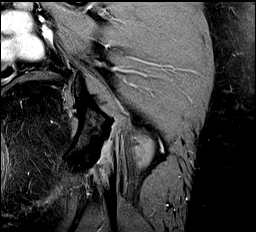
[im 4/19]
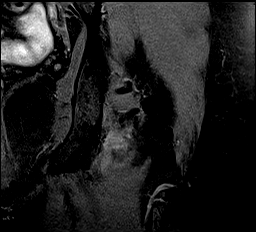
[im 7/19]
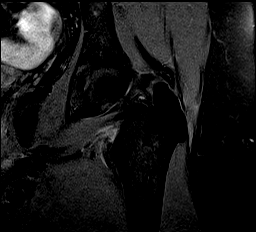
[im 10/19]
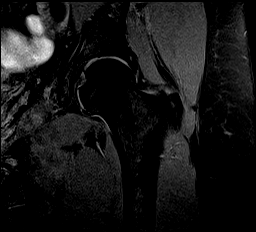
[im 13/19]
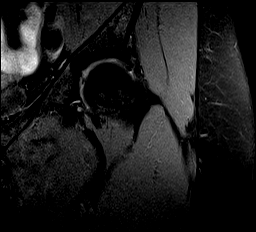
[im 16/19]
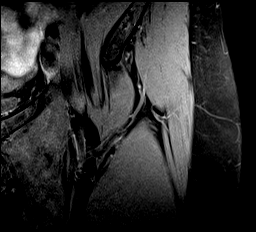
[im 19/19]
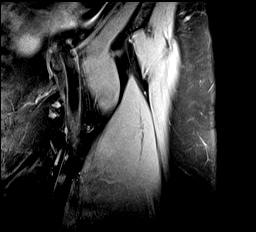

[40 of 40 positions shown; findings below may reference images not displayed]

FINDINGS: Tendinopathy and partial tearing of the left hamstring tendon from
its attachment site on the ischium. Maximum retraction of some of
the torn fibers is 7.5 mm. No complete rupture. There is also mild
tendinopathy involving the right hamstring tendons.

There is also fairly marked edema like signal abnormality and fluid
in the ischial femoral space. This space is narrowed by a prominent
lesser trochanter and I suspect the patient also has ischiofemoral
impingement syndrome with compression of the quadratus femoris
muscle. The iliopsoas tendon is intact.

The bony structures are intact. No stress fracture or AVN. No hip
joint effusion or periarticular fluid collections to suggest a
paralabral cyst. Mild bilateral peritendinitis but no trochanteric
bursitis.

Degenerative changes and suspected anterior superior labral tear.

The pubic symphysis and SI joints are intact. The visualized lumbar
spine appears grossly normal. The intervertebral discs appear
maintained.

No significant intrapelvic abnormalities are identified.
IMPRESSION: 1. Tendinopathy and partial tearing of the left hamstring at its
attachment site on the ischium.
2. MR findings suggest ischiofemoral impingement syndrome with
compression and edema of the quadratus femoris.
3. Degenerated and likely torn superior anterior labrum.
4. Mild bilateral peritendinitis but no trochanteric bursitis.
5. No stress fracture or AVN.

## 2022-12-10 ENCOUNTER — Ambulatory Visit: Payer: BC Managed Care – PPO | Admitting: Sports Medicine

## 2022-12-10 VITALS — BP 124/70 | Ht 66.0 in | Wt 132.0 lb

## 2022-12-10 DIAGNOSIS — M7042 Prepatellar bursitis, left knee: Secondary | ICD-10-CM | POA: Diagnosis not present

## 2022-12-10 NOTE — Progress Notes (Signed)
   Subjective:    Patient ID: Kim Gentry, female    DOB: 01/20/1966, 57 y.o.   MRN: [id number]  HPI chief complaint: Left knee pain  Kim Gentry is a very pleasant 57 year old female that comes in complaining of left knee pain that began with a fall 18 days ago when running.  She tripped over a rock landing directly on the left knee.  She suffered an abrasion at that time as well as some swelling in the anterior knee.  Her abrasion has been healing but she still experiences discomfort and swelling in the anterior knee when running.  She took a week off from running initially after her injury but has been able to resume running since then.   Does not radiate.  Overall, symptoms have been improving.    Review of Systems As above    Objective:   Physical Exam  Well-developed, well-nourished.  No acute distress  Left knee: Full range of motion.  No effusion.  There is slight swelling of the prepatellar bursa.  No erythema.  Small abrasion covered with a Band-Aid.  No signs of surrounding infection.  Knee is stable to valgus and varus stressing.  Negative anterior drawer, negative posterior drawer.  Negative McMurray's.  Neurovascularly intact distally.  Walking without a limp.  Limited MSK ultrasound of the left knee shows a mild amount of swelling in the prepatellar bursa.  No significant swelling in the suprapatellar pouch.      Assessment & Plan:   Left knee pain secondary to posttraumatic prepatellar bursitis  Kim Gentry may continue with activity using pain as her guide.  We will fit her with a compression sleeve today.  She may need to wait a few more days for her abrasion to heal more before wearing it with activity.  I would like for her symptoms to continue to improve.  She will follow-up for ongoing or recalcitrant issues.  This note was dictated using Dragon naturally speaking software and may contain errors in syntax, spelling, or content which have not been identified prior to signing this  note.
